# Patient Record
Sex: Female | Born: 1975 | Race: White | Hispanic: No | Marital: Married | Smoking: Never smoker
Health system: Southern US, Community
[De-identification: ages and names within clinical notes are randomized; demographics above are authoritative.]

## PROBLEM LIST (undated history)

## (undated) DIAGNOSIS — J45909 Unspecified asthma, uncomplicated: Secondary | ICD-10-CM

## (undated) DIAGNOSIS — N281 Cyst of kidney, acquired: Secondary | ICD-10-CM

## (undated) DIAGNOSIS — F32A Depression, unspecified: Secondary | ICD-10-CM

## (undated) DIAGNOSIS — Z8679 Personal history of other diseases of the circulatory system: Secondary | ICD-10-CM

## (undated) DIAGNOSIS — E039 Hypothyroidism, unspecified: Secondary | ICD-10-CM

## (undated) DIAGNOSIS — R7303 Prediabetes: Secondary | ICD-10-CM

## (undated) DIAGNOSIS — T7840XA Allergy, unspecified, initial encounter: Secondary | ICD-10-CM

## (undated) DIAGNOSIS — E079 Disorder of thyroid, unspecified: Secondary | ICD-10-CM

## (undated) DIAGNOSIS — F419 Anxiety disorder, unspecified: Secondary | ICD-10-CM

## (undated) DIAGNOSIS — F329 Major depressive disorder, single episode, unspecified: Secondary | ICD-10-CM

## (undated) HISTORY — DX: Allergy, unspecified, initial encounter: T78.40XA

## (undated) HISTORY — DX: Disorder of thyroid, unspecified: E07.9

## (undated) HISTORY — DX: Major depressive disorder, single episode, unspecified: F32.9

## (undated) HISTORY — DX: Anxiety disorder, unspecified: F41.9

## (undated) HISTORY — DX: Depression, unspecified: F32.A

## (undated) HISTORY — PX: OTHER SURGICAL HISTORY: SHX169

## (undated) HISTORY — DX: Cyst of kidney, acquired: N28.1

## (undated) HISTORY — PX: TONSILLECTOMY: SUR1361

---

## 2003-01-19 DIAGNOSIS — E079 Disorder of thyroid, unspecified: Secondary | ICD-10-CM

## 2003-01-19 HISTORY — DX: Disorder of thyroid, unspecified: E07.9

## 2013-01-18 ENCOUNTER — Ambulatory Visit (INDEPENDENT_AMBULATORY_CARE_PROVIDER_SITE_OTHER): Payer: BC Managed Care – PPO | Admitting: Family Medicine

## 2013-01-18 VITALS — BP 124/78 | HR 74 | Temp 98.0°F | Resp 17 | Ht 65.0 in | Wt 190.0 lb

## 2013-01-18 DIAGNOSIS — E039 Hypothyroidism, unspecified: Secondary | ICD-10-CM

## 2013-01-18 DIAGNOSIS — F3289 Other specified depressive episodes: Secondary | ICD-10-CM

## 2013-01-18 DIAGNOSIS — F329 Major depressive disorder, single episode, unspecified: Secondary | ICD-10-CM

## 2013-01-18 DIAGNOSIS — I498 Other specified cardiac arrhythmias: Secondary | ICD-10-CM

## 2013-01-18 DIAGNOSIS — R001 Bradycardia, unspecified: Secondary | ICD-10-CM

## 2013-01-18 DIAGNOSIS — Z23 Encounter for immunization: Secondary | ICD-10-CM

## 2013-01-18 LAB — POCT CBC
Granulocyte percent: 59.9 %G (ref 37–80)
HCT, POC: 47.5 % (ref 37.7–47.9)
Hemoglobin: 14.9 g/dL (ref 12.2–16.2)
LYMPH, POC: 2.9 (ref 0.6–3.4)
MCH, POC: 28.7 pg (ref 27–31.2)
MCHC: 31.4 g/dL — AB (ref 31.8–35.4)
MCV: 91.6 fL (ref 80–97)
MID (cbc): 0.7 (ref 0–0.9)
MPV: 10.3 fL (ref 0–99.8)
POC Granulocyte: 5.3 (ref 2–6.9)
POC LYMPH %: 32.4 % (ref 10–50)
POC MID %: 7.7 % (ref 0–12)
Platelet Count, POC: 324 10*3/uL (ref 142–424)
RBC: 5.19 M/uL (ref 4.04–5.48)
RDW, POC: 15 %
WBC: 8.8 10*3/uL (ref 4.6–10.2)

## 2013-01-18 LAB — TSH: TSH: 15.737 u[IU]/mL — ABNORMAL HIGH (ref 0.350–4.500)

## 2013-01-18 LAB — T4, FREE: Free T4: 0.63 ng/dL — ABNORMAL LOW (ref 0.80–1.80)

## 2013-01-18 MED ORDER — LEVOTHYROXINE SODIUM 100 MCG PO TABS: 100.0000 ug | ORAL_TABLET | Freq: Every day | ORAL | Status: DC

## 2013-01-18 NOTE — Progress Notes (Signed)
Subjective:    Patient ID: Rebecca Meza, female    DOB: 12/12/1975, 38 y.o.   MRN: [id number]  HPI Scribed for Nilda SimmerKristi Jaylaa Gallion MD, the patient was seen in room 9. This chart was scribed by Lewanda RifeAlexandra Hurtado, ED scribe. Patient's care was started at 9:53 AM  HPI Comments: Hx was provided by pt. Rebecca Meza is a 38 y.o. female who presents to the [facility] to be established as a new pt. Pt is requesting TSH and T4 checked. Reports she takes synthroid, but has not taken it since 10/2012. Reports PMHx of hypothyroidism, D&C, and Tonsillectomy. Reports constant mild fatigue. Reports fatigue is alleviated when taking synthroid.   Reports unremarkable pap smear 11/2011 with no hx of abnormal pap smear. Denies hx of mammogram. Denies hx of colonoscopy. Reports unknown tetanus status. Denies getting a flu shot this year. Reports last eye exam was 12/2012. Last Dentist appointment 12/2012.   Reports she has been married for 8 years. Denies smoking cigarettes. 2-3 glasses of alcohol per week. Reports she moved from [place]. Reports taking Citalopram for anxiety and denies other medications at this time.    LMP 12/27/2012 Reports she gets her period every 3 weeks.   Mother is 3265 PMHx of hypothyroidism and cholecystectomy  Father 3060 Non-hodgkin's lymphoma and pancreatic cancer   Sister is 3940 with no significant PMHx   Past Medical History  Diagnosis Date  . Allergy   . Anxiety   . Depression   . Thyroid disease     History reviewed. No pertinent past surgical history.  Family History  Problem Relation Age of Onset  . Cancer Father     History   Social History  . Marital Status: Married    Spouse Name: N/A    Number of Children: N/A  . Years of Education: N/A   Occupational History  . Not on file.   Social History Main Topics  . Smoking status: Never Smoker   . Smokeless tobacco: Not on file  . Alcohol Use: No  . Drug Use: No  . Sexual Activity: No    Other Topics Concern  . Not on file   Social History Narrative  . No narrative on file    No Known Allergies  There are no active problems to display for this patient.   Review of Systems  Constitutional: Negative for fever.  Endocrine:       Fatigue   Psychiatric/Behavioral:       Mild depression        A complete 10 system review of systems was obtained and all systems are negative except as noted in the HPI and PMHx.    Objective:   Physical Exam  Nursing note and vitals reviewed. Constitutional: She is oriented to person, place, and time. She appears well-developed and well-nourished. No distress.  HENT:  Head: Normocephalic and atraumatic.  Eyes: Conjunctivae and EOM are normal.  Neck: Neck supple. No tracheal deviation present. No thyromegaly present.  Cardiovascular: Regular rhythm.  Bradycardia present.   No murmur heard. Pulmonary/Chest: Effort normal and breath sounds normal. No respiratory distress. She has no wheezes. She has no rales.  Musculoskeletal: Normal range of motion.  Lymphadenopathy:    She has no cervical adenopathy.  Neurological: She is alert and oriented to person, place, and time.  Skin: Skin is warm and dry.  Psychiatric: She has a normal mood and affect. Her behavior is normal.   COORDINATION OF CARE:  Nursing notes reviewed. Vital signs reviewed. Initial pt interview and examination performed.   9:53 AM-Discussed work up plan with pt at bedside, which includes EKG, T4 and TSH. Pt agrees with plan.   Treatment plan initiated:Medications - No data to display   Initial diagnostic testing ordered.   Results for orders placed in visit on 01/18/13  POCT CBC      Result Value Range   WBC 8.8  4.6 - 10.2 K/uL   Lymph, poc 2.9  0.6 - 3.4   POC LYMPH PERCENT 32.4  10 - 50 %L   MID (cbc) 0.7  0 - 0.9   POC MID % 7.7  0 - 12 %M   POC Granulocyte 5.3  2 - 6.9   Granulocyte percent 59.9  37 - 80 %G   RBC 5.19  4.04 - 5.48 M/uL    Hemoglobin 14.9  12.2 - 16.2 g/dL   HCT, POC 65.7  84.6 - 47.9 %   MCV 91.6  80 - 97 fL   MCH, POC 28.7  27 - 31.2 pg   MCHC 31.4 (*) 31.8 - 35.4 g/dL   RDW, POC 96.2     Platelet Count, POC 324  142 - 424 K/uL   MPV 10.3  0 - 99.8 fL   INFLUENZA VACCINE ADMINISTERED IN OFFICE.  1. Unspecified hypothyroidism   2. Need for prophylactic vaccination and inoculation against influenza   3. Depressive disorder, not elsewhere classified   4. Bradycardia          Assessment & Plan:  Unspecified hypothyroidism - Plan: POCT CBC, TSH, T4, Free  Need for prophylactic vaccination and inoculation against influenza  Depressive disorder, not elsewhere classified  Bradycardia - Plan: EKG 12-Lead  1. Hypothyroidism:  Uncontrolled due to non-compliance with medication due to lack of insurance and recent move from [place], [place].  Obtain labs. Start Synthroid daily.  RTC 2-3 months for recheck. 2.  Bradycardia: chronic per patient; s/p cardiology consultation in 2009 after D&C due to bradycardia and was released from care.  S/p EKG. 3.  Depression seasonal:  Recurrent; restarted Citalopram in past two weeks.  Emotionally stable.  4.  S/p flu vaccine in office.  Meds ordered this encounter  Medications  . citalopram (CELEXA) 20 MG tablet    Sig: Take 20 mg by mouth daily.  Marland Kitchen levothyroxine (SYNTHROID) 100 MCG tablet    Sig: Take 1 tablet (100 mcg total) by mouth daily before breakfast.    Dispense:  30 tablet    Refill:  3    I personally performed the services described in this documentation, which was scribed in my presence.  The recorded information has been reviewed and is accurate.  Nilda Simmer, M.D.  [facility] [address] [place], [place]  [address] [phone] phone [phone] fax

## 2013-01-24 ENCOUNTER — Telehealth: Payer: Self-pay | Admitting: Family Medicine

## 2013-01-24 NOTE — Telephone Encounter (Signed)
Message copied by Tilman NeatPETTY, Kristi Norment L on Wed Jan 24, 2013  8:41 AM ------      Message from: Ethelda ChickSMITH, KRISTI M      Created: Thu Jan 18, 2013 10:01 AM       Please schedule follow-up OV with me in 3 months. ------

## 2013-01-24 NOTE — Telephone Encounter (Signed)
Appt. Has been made for April 23, 2013.

## 2013-04-16 ENCOUNTER — Telehealth: Payer: Self-pay | Admitting: *Deleted

## 2013-04-16 DIAGNOSIS — E039 Hypothyroidism, unspecified: Secondary | ICD-10-CM

## 2013-04-16 NOTE — Telephone Encounter (Signed)
Pt coming in for a thyroid check. Do you want us to place the order for a TSH for patient to come in before her appt?

## 2013-04-16 NOTE — Telephone Encounter (Signed)
Pt. Has an appt. W/ Dr. Katrinka BlazingSmith next Monday. She would like to know if we can have the orders put in the computer so that she can come in the next couple of days & have her blood work done so the results will be in by her appt. Please call [phone]

## 2013-04-16 NOTE — Telephone Encounter (Signed)
Yes, TSH and free T4  Dx: 244.9.  Thanks.

## 2013-04-17 NOTE — Telephone Encounter (Signed)
Order has been place for future order. Pt has been advised. She will be in later this week.

## 2013-04-18 ENCOUNTER — Other Ambulatory Visit (INDEPENDENT_AMBULATORY_CARE_PROVIDER_SITE_OTHER): Payer: BC Managed Care – PPO | Admitting: Radiology

## 2013-04-18 DIAGNOSIS — E039 Hypothyroidism, unspecified: Secondary | ICD-10-CM

## 2013-04-18 LAB — T4, FREE: Free T4: 1 ng/dL (ref 0.80–1.80)

## 2013-04-18 LAB — TSH: TSH: 5.711 u[IU]/mL — ABNORMAL HIGH (ref 0.350–4.500)

## 2013-04-18 NOTE — Progress Notes (Signed)
Pt here for labs only. 

## 2013-04-23 ENCOUNTER — Ambulatory Visit (INDEPENDENT_AMBULATORY_CARE_PROVIDER_SITE_OTHER): Payer: BC Managed Care – PPO | Admitting: Family Medicine

## 2013-04-23 ENCOUNTER — Encounter: Payer: Self-pay | Admitting: Family Medicine

## 2013-04-23 VITALS — BP 120/76 | HR 63 | Temp 98.2°F | Resp 16 | Ht 64.5 in | Wt 183.4 lb

## 2013-04-23 DIAGNOSIS — F411 Generalized anxiety disorder: Secondary | ICD-10-CM

## 2013-04-23 DIAGNOSIS — E669 Obesity, unspecified: Secondary | ICD-10-CM

## 2013-04-23 DIAGNOSIS — E039 Hypothyroidism, unspecified: Secondary | ICD-10-CM

## 2013-04-23 MED ORDER — LEVOTHYROXINE SODIUM 125 MCG PO TABS: 125.0000 ug | ORAL_TABLET | Freq: Every day | ORAL | Status: DC

## 2013-04-23 NOTE — Progress Notes (Signed)
Subjective:    Patient ID: Rebecca Meza, female    DOB: 07/12/1975, 38 y.o.   MRN: [id number]  This chart was scribed for Ethelda ChickKristi M Germany Chelf, MD by Blanchard KelchNicole Curnes, ED Scribe. The patient was seen in room 21. Patient's care was started at 11:42 AM.  Chief Complaint  Patient presents with  . Follow-up    labs-thyroid levels and consult on weight loss   PCP: No PCP Per Patient  HPI  Rebecca HausenSuzanne E Adcock is a 38 y.o. female who presents to office for a follow up for hypothyroidism/depression with anxiety. Last office visit was 01/2013. This is a three month follow up.    1. Hypothyroidism: Her TSH level on 04/18/13 was 5.7 and free T4 was 1.00. She has been taking her Synthroid 100mcg daily compliantly. She has lost seven pounds since being seen three months ago. However, she states that her biggest complaint has been not being able to lose weight for ten years. She has had issues with tendonitis and soft tissue injuries that have caused her to be unable to even do low impact exercise. She states that even with walking she tends to pull a groin muscle. She has torn cartilage in her knee with biking.    2. Depression with Anxiety: She restarted her Celexa at the last visit and states she took it for about two weeks before stopping. Her anxiety is typically better on Celexa. She states her mood and anxiety have been relatively stable until recently. She is now having anxious days every day. She attributes any changes to issues with her thyroid and states she can tell that when her mood is off so is her thyroid. She is considering weight loss medication and was wondering about the possible complications with taking the medications concurrently or if those medications would be an option. She has been sleeping well for the most part. She sometimes wakes up early at 4:00 AM and is unable to go back to sleep until its time to get up. She states this is a chronic thing and is unsure if it is due to anxiety. She  denies HI or SI.   3. Weight Loss: Her weight has been fluctuating for ten years and she is unable to keep it down permanently. She typically eats a bone broth with a veggie egg omelet. She tends to eat unhealthy snacks for lunch and then a small portion for dinner. She states she is always hungry. She incorporates a lot of protein in her diet. She drinks water during the day and a glass of milk occasionally. She will have a cup of tea or coffee in the morning. She drinks soda about once a month at maximum. She drinks wine about twice a week on average.      There are no active problems to display for this patient.  Past Medical History  Diagnosis Date  . Allergy   . Anxiety   . Depression   . Thyroid disease    No past surgical history on file. No Known Allergies Prior to Admission medications   Medication Sig Start Date End Date Taking? Authorizing Provider  citalopram (CELEXA) 20 MG tablet Take 20 mg by mouth daily.    Historical Provider, MD  levothyroxine (SYNTHROID) 100 MCG tablet Take 1 tablet (100 mcg total) by mouth daily before breakfast. 01/18/13   Ethelda ChickKristi M Kenzley Ke, MD   History   Social History  . Marital Status: Married    Spouse Name: N/A  Number of Children: N/A  . Years of Education: N/A   Occupational History  . Not on file.   Social History Main Topics  . Smoking status: Never Smoker   . Smokeless tobacco: Not on file  . Alcohol Use: No  . Drug Use: No  . Sexual Activity: No   Other Topics Concern  . Not on file   Social History Narrative  . No narrative on file    Review of Systems  Constitutional: Negative for fever, appetite change and unexpected weight change.  HENT: Negative for drooling.   Eyes: Negative for discharge.  Respiratory: Negative for cough.   Cardiovascular: Negative for leg swelling.  Gastrointestinal: Negative for vomiting.  Endocrine: Negative for cold intolerance, heat intolerance, polydipsia, polyphagia and polyuria.    Genitourinary: Negative for hematuria.  Musculoskeletal: Negative for gait problem.  Skin: Negative for rash.  Allergic/Immunologic: Negative for immunocompromised state.  Neurological: Negative for dizziness, tremors, speech difficulty and light-headedness.  Hematological: Negative for adenopathy.  Psychiatric/Behavioral: Positive for sleep disturbance. Negative for suicidal ideas, confusion and self-injury. The patient is nervous/anxious.        Objective:   Physical Exam  Nursing note and vitals reviewed. Constitutional: She is oriented to person, place, and time. She appears well-developed and well-nourished. No distress.  HENT:  Head: Normocephalic and atraumatic.  Mouth/Throat: Oropharynx is clear and moist.  Eyes: Conjunctivae and EOM are normal. Pupils are equal, round, and reactive to light.  Neck: Neck supple. No tracheal deviation present. No thyromegaly present.  Cardiovascular: Normal rate, regular rhythm and normal heart sounds.  Exam reveals no gallop and no friction rub.   No murmur heard. Pulmonary/Chest: Effort normal and breath sounds normal. No respiratory distress. She has no wheezes. She has no rales.  Abdominal: Soft. Bowel sounds are normal. She exhibits no distension. There is no tenderness.  Musculoskeletal: Normal range of motion.  Lymphadenopathy:    She has no cervical adenopathy.  Neurological: She is alert and oriented to person, place, and time.  Skin: Skin is warm and dry.  Psychiatric: She has a normal mood and affect. Her behavior is normal.       Assessment & Plan:  Unspecified hypothyroidism  Generalized anxiety disorder  Obesity, unspecified  1. Hypothyroidism: uncontrolled yet improved; reviewed recent lab results with patient during visit; increase Synthroid to daily.  Follow-up in three months. 2.  Anxiety: uncontrolled due to non-compliance with Celexa; advised to restart Celexa.  Chronic anxiety thus likely warrants chronic  medication. 3.  Obesity: improved since last visit; advised to continue to attempt to exercise even if low-impact; also advised to limit caloric intake to 1200-1500kcal per day.    Meds ordered this encounter  Medications  . levothyroxine (SYNTHROID) 125 MCG tablet    Sig: Take 1 tablet (125 mcg total) by mouth daily before breakfast.    Dispense:  30 tablet    Refill:  5   I personally performed the services described in this documentation, which was scribed in my presence.  The recorded information has been reviewed and is accurate.  Nilda Simmer, M.D.  [facility] [address] [place], [place]  [address] [phone] phone [phone] fax

## 2013-07-23 ENCOUNTER — Encounter: Payer: Self-pay | Admitting: Family Medicine

## 2013-07-23 ENCOUNTER — Ambulatory Visit (INDEPENDENT_AMBULATORY_CARE_PROVIDER_SITE_OTHER): Payer: BC Managed Care – PPO | Admitting: Family Medicine

## 2013-07-23 VITALS — BP 112/78 | HR 47 | Temp 98.7°F | Resp 16

## 2013-07-23 DIAGNOSIS — Z13220 Encounter for screening for lipoid disorders: Secondary | ICD-10-CM

## 2013-07-23 DIAGNOSIS — E039 Hypothyroidism, unspecified: Secondary | ICD-10-CM

## 2013-07-23 DIAGNOSIS — M25511 Pain in right shoulder: Secondary | ICD-10-CM

## 2013-07-23 DIAGNOSIS — Z01419 Encounter for gynecological examination (general) (routine) without abnormal findings: Secondary | ICD-10-CM

## 2013-07-23 DIAGNOSIS — Z131 Encounter for screening for diabetes mellitus: Secondary | ICD-10-CM

## 2013-07-23 DIAGNOSIS — Z0000 Encounter for general adult medical examination without abnormal findings: Secondary | ICD-10-CM

## 2013-07-23 DIAGNOSIS — M25519 Pain in unspecified shoulder: Secondary | ICD-10-CM

## 2013-07-23 DIAGNOSIS — L989 Disorder of the skin and subcutaneous tissue, unspecified: Secondary | ICD-10-CM

## 2013-07-23 LAB — CBC WITH DIFFERENTIAL/PLATELET
Basophils Absolute: 0 10*3/uL (ref 0.0–0.1)
Basophils Relative: 0 % (ref 0–1)
EOS PCT: 3 % (ref 0–5)
Eosinophils Absolute: 0.2 10*3/uL (ref 0.0–0.7)
HEMATOCRIT: 44.2 % (ref 36.0–46.0)
HEMOGLOBIN: 15.2 g/dL — AB (ref 12.0–15.0)
LYMPHS PCT: 29 % (ref 12–46)
Lymphs Abs: 1.9 10*3/uL (ref 0.7–4.0)
MCH: 28.4 pg (ref 26.0–34.0)
MCHC: 34.4 g/dL (ref 30.0–36.0)
MCV: 82.5 fL (ref 78.0–100.0)
MONO ABS: 0.5 10*3/uL (ref 0.1–1.0)
MONOS PCT: 7 % (ref 3–12)
NEUTROS ABS: 4 10*3/uL (ref 1.7–7.7)
Neutrophils Relative %: 61 % (ref 43–77)
Platelets: 314 10*3/uL (ref 150–400)
RBC: 5.36 MIL/uL — ABNORMAL HIGH (ref 3.87–5.11)
RDW: 14.3 % (ref 11.5–15.5)
WBC: 6.6 10*3/uL (ref 4.0–10.5)

## 2013-07-23 LAB — COMPLETE METABOLIC PANEL WITH GFR
ALBUMIN: 4.4 g/dL (ref 3.5–5.2)
ALT: 9 U/L (ref 0–35)
AST: 14 U/L (ref 0–37)
Alkaline Phosphatase: 55 U/L (ref 39–117)
BUN: 13 mg/dL (ref 6–23)
CALCIUM: 9.7 mg/dL (ref 8.4–10.5)
CHLORIDE: 107 meq/L (ref 96–112)
CO2: 24 meq/L (ref 19–32)
Creat: 0.85 mg/dL (ref 0.50–1.10)
GFR, Est African American: >89 mL/min
GFR, Est Non African American: 87 mL/min
GLUCOSE: 97 mg/dL (ref 70–99)
POTASSIUM: 4.8 meq/L (ref 3.5–5.3)
Sodium: 140 mEq/L (ref 135–145)
Total Bilirubin: 0.5 mg/dL (ref 0.2–1.2)
Total Protein: 7.1 g/dL (ref 6.0–8.3)

## 2013-07-23 LAB — POCT URINALYSIS DIPSTICK
Bilirubin, UA: NEGATIVE
Glucose, UA: NEGATIVE
Ketones, UA: NEGATIVE
NITRITE UA: NEGATIVE
PROTEIN UA: NEGATIVE
RBC UA: NEGATIVE
SPEC GRAV UA: 1.02
UROBILINOGEN UA: 0.2
pH, UA: 5.5

## 2013-07-23 LAB — T4, FREE: FREE T4: 1.14 ng/dL (ref 0.80–1.80)

## 2013-07-23 LAB — HEMOGLOBIN A1C
Hgb A1c MFr Bld: 5.5 % (ref <5.7)
Mean Plasma Glucose: 111 mg/dL (ref <117)

## 2013-07-23 LAB — TSH: TSH: 0.988 u[IU]/mL (ref 0.350–4.500)

## 2013-07-23 LAB — LIPID PANEL
Cholesterol: 211 mg/dL — ABNORMAL HIGH (ref 0–200)
HDL: 48 mg/dL (ref >39)
LDL CALC: 141 mg/dL — AB (ref 0–99)
Total CHOL/HDL Ratio: 4.4 Ratio
Triglycerides: 109 mg/dL (ref <150)
VLDL: 22 mg/dL (ref 0–40)

## 2013-07-23 NOTE — Progress Notes (Signed)
Subjective:  This chart was scribed for  Rebecca Forts, MD  by Rebecca Meza, [facility] Scribe. The patient was seen in room and the patient's care was started at 12:44 PM.  Patient ID: Rebecca Meza, female    DOB: 01/07/1976, 38 y.o.   MRN: [id number]  07/23/2013  Annual Exam, sore on nose, sore on back and Shoulder Pain   Shoulder Pain    HPI Comments: Rebecca Meza is a 38 y.o. female who arrives to the [facility] for an annual exam. Pt reports having a persistent scar on her nose and back that will not resolve.She also complains of left shoulder pain that is worse with extension. The pain is worse with walking and she denies pain with running. Denies neck pain. Denies chest pain. She has leg swelling if she is standing on her feet. Pt denies SOB. She has warts on the bottom of her foot. Denies trouble hearing or visual disturbances. Denies hearing loss or tinnitus. Denies urinary or bowel incontinence. She has abdominal pain with eating chocolate. Denies diarrhea, constipation, and hematochezia.     Pt's last eye exam was two years ago and wears prescription reading glasses. Her last visit to the dentist was last year. She has year round allergies and take benadryl as need. Pt has a regular menses that is not very heavy. Denies spotting or abnormal pap smear.Pt has left breast pain just before her mense starts.   Pt's last physical was 11/2012. Pt had a normal pap smear. She never had a mammogram or colonoscopy. Pt is unsure of her last tetanus shot.    She had two children, 55 and 53 years old. Pt had a pertussis shot the day her youngest child was born, three years ago in the hospital.    She has a hx of borderline thyroid conditions and has been on medication for the past 10 years. Pt's mother had thyroid complication. Denies hx of skin cancer.   Her 80 y.o. mother has a hx of heart palpitations, gallstones, thyroid issues. and basal skin  cancer. Pt's father has non active pancreatic cancer and is on chemotherapy. She has a sister and no brothers.   Pt is happily married for the past eight years. She moved from [place] last year. Pt is a stay at home. She does not smoke and drinks occasionally. Denies any drugs. Pt runs 9-11 miles a week. She wears her seat belt. Pt wears 50  Spf sunscreen.    Pt does not have any allergies to medications. Pt lost 5 lbs in 3 months.  She has never seen a dermatologist.   Review of Systems  Constitutional: Negative for unexpected weight change.  HENT: Negative for tinnitus.   Eyes: Negative for visual disturbance.  Respiratory: Negative for shortness of breath.   Cardiovascular: Positive for palpitations and leg swelling. Negative for chest pain.  Gastrointestinal: Negative for nausea, vomiting, abdominal pain, diarrhea, constipation and blood in stool.  Genitourinary: Negative for enuresis and menstrual problem.  Musculoskeletal: Positive for arthralgias and myalgias. Negative for joint swelling.  Skin: Positive for wound.  Allergic/Immunologic: Positive for food allergies.  Neurological: Negative for headaches.  Psychiatric/Behavioral: Negative for sleep disturbance.  All other systems reviewed and are negative.   Past Medical History  Diagnosis Date   Anxiety    Depression    Allergy     oral antihistamine; seasonal.   Thyroid disease 01/19/2003    hypothyroidism  History reviewed. No pertinent past surgical history.  No Known Allergies Current Outpatient Prescriptions  Medication Sig Dispense Refill   levothyroxine (SYNTHROID) 125 MCG tablet Take 1 tablet (125 mcg total) by mouth daily before breakfast.  30 tablet  5   citalopram (CELEXA) 20 MG tablet Take 20 mg by mouth daily.       No current facility-administered medications for this visit.   History   Social History   Marital Status: Married    Spouse Name: N/A    Number of Children: N/A   Years of Education:  N/A   Occupational History   Not on file.   Social History Main Topics   Smoking status: Never Smoker    Smokeless tobacco: Not on file   Alcohol Use: No   Drug Use: No   Sexual Activity: No   Other Topics Concern   Not on file   Social History Narrative   Marital status: married x 8 years; happily married; no abuse; moved from [place] in 2014.      Children two children (8,3)      Lives: with husband, two children.      Employment:  Homemaker      Tobacco: none      Alcohol:  Socially      Drugs: none      Exercise:  Running every other day 2 miles alternating with 3 miles.       Seatbelt:  100%      Guns:  None      Sunscreen: SPF 50-70   Family History  Problem Relation Age of Onset   Cancer Father 66    pancreatic cancer   Hypothyroidism Mother        Objective:    BP 112/78   Pulse 47   Temp(Src) 98.7 F (37.1 C) (Oral)   Resp 16   SpO2 99%   LMP 06/29/2013  Physical Exam  Nursing note and vitals reviewed. Constitutional: She is oriented to person, place, and time. She appears well-developed and well-nourished. No distress.  HENT:  Head: Normocephalic and atraumatic.  Right Ear: External ear normal.  Left Ear: External ear normal.  Nose: Nose normal.  Mouth/Throat: Oropharynx is clear and moist.  Dry scaly patch on the bridge of her nose  Eyes: Conjunctivae and EOM are normal. Pupils are equal, round, and reactive to light.  Neck: Normal range of motion and full passive range of motion without pain. Neck supple. No JVD present. Carotid bruit is not present. No tracheal deviation present. No thyromegaly present.  Cardiovascular: Normal rate, regular rhythm and normal heart sounds.  Exam reveals no gallop and no friction rub.   No murmur heard. Pulmonary/Chest: Effort normal and breath sounds normal. No respiratory distress. She has no wheezes. She has no rales. Right breast exhibits no inverted nipple, no mass, no nipple discharge, no skin change and  no tenderness. Left breast exhibits no inverted nipple, no mass, no nipple discharge, no skin change and no tenderness. Breasts are symmetrical.  Abdominal: Soft. Bowel sounds are normal. She exhibits no distension and no mass. There is no tenderness. There is no rebound and no guarding.  Genitourinary: Vagina normal and uterus normal. There is no rash, tenderness or lesion on the right labia. There is no rash, tenderness or lesion on the left labia. Cervix exhibits no motion tenderness. Right adnexum displays no mass, no tenderness and no fullness. Left adnexum displays no mass, no tenderness and no fullness.  Musculoskeletal:  Normal range of motion.       Right shoulder: Normal.       Left shoulder: Normal.       Cervical back: Normal.  Lymphadenopathy:    She has no cervical adenopathy.  Neurological: She is alert and oriented to person, place, and time. She has normal reflexes. No cranial nerve deficit. She exhibits normal muscle tone. Coordination normal.  Skin: Skin is warm and dry. No rash noted. She is not diaphoretic. No erythema. No pallor.  Psychiatric: She has a normal mood and affect. Her behavior is normal. Judgment and thought content normal.   Results for orders placed in visit on 07/23/13  CBC WITH DIFFERENTIAL      Result Value Ref Range   WBC 6.6  4.0 - 10.5 K/uL   RBC 5.36 (*) 3.87 - 5.11 MIL/uL   Hemoglobin 15.2 (*) 12.0 - 15.0 g/dL   HCT 44.2  36.0 - 46.0 %   MCV 82.5  78.0 - 100.0 fL   MCH 28.4  26.0 - 34.0 pg   MCHC 34.4  30.0 - 36.0 g/dL   RDW 14.3  11.5 - 15.5 %   Platelets 314  150 - 400 K/uL   Neutrophils Relative % 61  43 - 77 %   Neutro Abs 4.0  1.7 - 7.7 K/uL   Lymphocytes Relative 29  12 - 46 %   Lymphs Abs 1.9  0.7 - 4.0 K/uL   Monocytes Relative 7  3 - 12 %   Monocytes Absolute 0.5  0.1 - 1.0 K/uL   Eosinophils Relative 3  0 - 5 %   Eosinophils Absolute 0.2  0.0 - 0.7 K/uL   Basophils Relative 0  0 - 1 %   Basophils Absolute 0.0  0.0 - 0.1 K/uL    Smear Review Criteria for review not met    COMPLETE METABOLIC PANEL WITH GFR      Result Value Ref Range   Sodium 140  135 - 145 mEq/L   Potassium 4.8  3.5 - 5.3 mEq/L   Chloride 107  96 - 112 mEq/L   CO2 24  19 - 32 mEq/L   Glucose, Bld 97  70 - 99 mg/dL   BUN 13  6 - 23 mg/dL   Creat 0.85  0.50 - 1.10 mg/dL   Total Bilirubin 0.5  0.2 - 1.2 mg/dL   Alkaline Phosphatase 55  39 - 117 U/L   AST 14  0 - 37 U/L   ALT 9  0 - 35 U/L   Total Protein 7.1  6.0 - 8.3 g/dL   Albumin 4.4  3.5 - 5.2 g/dL   Calcium 9.7  8.4 - 10.5 mg/dL   GFR, Est African American >89     GFR, Est Non African American 87    HEMOGLOBIN A1C      Result Value Ref Range   Hemoglobin A1C 5.5  <5.7 %   Mean Plasma Glucose 111  <117 mg/dL  LIPID PANEL      Result Value Ref Range   Cholesterol 211 (*) 0 - 200 mg/dL   Triglycerides 109  <150 mg/dL   HDL 48  >39 mg/dL   Total CHOL/HDL Ratio 4.4     VLDL 22  0 - 40 mg/dL   LDL Cholesterol 141 (*) 0 - 99 mg/dL  T4, FREE      Result Value Ref Range   Free T4 1.14  0.80 - 1.80 ng/dL  TSH  Result Value Ref Range   TSH 0.988  0.350 - 4.500 uIU/mL  POCT URINALYSIS DIPSTICK      Result Value Ref Range   Color, UA yellow     Clarity, UA clear     Glucose, UA neg     Bilirubin, UA neg     Ketones, UA neg     Spec Grav, UA 1.020     Blood, UA neg     pH, UA 5.5     Protein, UA neg     Urobilinogen, UA 0.2     Nitrite, UA neg     Leukocytes, UA small (1+)    PAP IG AND HPV HIGH-RISK      Result Value Ref Range   HPV DNA High Risk Not Detected     Specimen adequacy: SEE NOTE     FINAL DIAGNOSIS: SEE NOTE     Cytotechnologist: SEE NOTE         Assessment & Plan:  Routine general medical examination at a health care facility  Unspecified hypothyroidism - Plan: CBC with Differential, T4, free, TSH  Skin lesion - Plan: Ambulatory referral to Dermatology  Pain in joint, shoulder region, right - Plan: Ambulatory referral to Orthopedic Surgery  Routine  gynecological examination - Plan: POCT urinalysis dipstick, Pap IG and HPV (high risk) DNA detection  Screening cholesterol level - Plan: Lipid panel  Screening for diabetes mellitus - Plan: COMPLETE METABOLIC PANEL WITH GFR, Hemoglobin A1c  1. Complete Physical examination: anticipatory guidance --- weight loss, continued exercise. Pap smear obtained.  Immunizations UTD.  2. Gynecological exam: pap smear obtained; breast exam and pelvic examinations completed. 3.  Hypothyroidism: controlled; continue current medication.  4.  Skin lesion nasal: New. Refer to dermatology. 5.  R shoulder pain: chronic with recent worsening; refer to ortho.   No orders of the defined types were placed in this encounter.    No Follow-up on file.  I personally performed the services described in this documentation, which was scribed in my presence.  The recorded information has been reviewed and is accurate.  Rebecca Meza, M.D.  [facility] [address] [place], [place]  [address] [phone] phone [phone] fax

## 2013-07-25 LAB — PAP IG AND HPV HIGH-RISK: HPV DNA HIGH RISK: NOT DETECTED

## 2013-07-31 ENCOUNTER — Encounter: Payer: Self-pay | Admitting: Family Medicine

## 2013-07-31 DIAGNOSIS — E039 Hypothyroidism, unspecified: Secondary | ICD-10-CM | POA: Insufficient documentation

## 2013-07-31 MED ORDER — LEVOTHYROXINE SODIUM 125 MCG PO TABS: 125.0000 ug | ORAL_TABLET | Freq: Every day | ORAL | Status: DC

## 2013-10-04 ENCOUNTER — Other Ambulatory Visit: Payer: Self-pay | Admitting: Family Medicine

## 2013-11-12 ENCOUNTER — Telehealth: Payer: Self-pay

## 2013-11-12 NOTE — Telephone Encounter (Signed)
Pt would like to let Dr. Katrinka BlazingSmith know that she is having a hard time with her allergies and over the counter medications are not helping. The pt is requesting Singulair if Dr. Katrinka BlazingSmith can agree to it.

## 2013-11-12 NOTE — Telephone Encounter (Signed)
Call--- I don't see Singulair on her medication list nor any allergy medication.  Recommend Claritin or Zyrtec or Allegra daily; also recommend OTC Flonase once daily. If no improvement with this regimen in two weeks, recommend office visit.

## 2013-11-13 NOTE — Telephone Encounter (Signed)
Pt CB and reported that she has been using Claritin and Zyrtec, but has not tried Flonase. She did say that she has tried Nasacort and it makes her sneeze a lot and nose sore. She agreed to try the Flonase as advised to see if she tolerates it and if it helps. She will CB or RTC.

## 2013-11-13 NOTE — Telephone Encounter (Signed)
LM for rtn call. 

## 2013-12-22 ENCOUNTER — Ambulatory Visit (INDEPENDENT_AMBULATORY_CARE_PROVIDER_SITE_OTHER): Payer: BC Managed Care – PPO | Admitting: Emergency Medicine

## 2013-12-22 VITALS — BP 140/90 | HR 69 | Temp 99.1°F | Resp 16 | Ht 65.0 in | Wt 189.0 lb

## 2013-12-22 DIAGNOSIS — J209 Acute bronchitis, unspecified: Secondary | ICD-10-CM

## 2013-12-22 DIAGNOSIS — J0140 Acute pansinusitis, unspecified: Secondary | ICD-10-CM

## 2013-12-22 MED ORDER — PSEUDOEPHEDRINE-GUAIFENESIN ER 60-600 MG PO TB12: 1.0000 | ORAL_TABLET | Freq: Two times a day (BID) | ORAL | Status: DC

## 2013-12-22 MED ORDER — PROMETHAZINE-CODEINE 6.25-10 MG/5ML PO SYRP: 5.0000 mL | ORAL_SOLUTION | Freq: Four times a day (QID) | ORAL | Status: DC | PRN

## 2013-12-22 MED ORDER — AMOXICILLIN-POT CLAVULANATE 875-125 MG PO TABS: 1.0000 | ORAL_TABLET | Freq: Two times a day (BID) | ORAL | Status: DC

## 2013-12-22 NOTE — Patient Instructions (Signed)

## 2013-12-22 NOTE — Progress Notes (Signed)
[facility] [address], [place] [id number] [phone]  Date:  12/22/2013   Name:  Rebecca Meza   DOB:  04/26/1975   MRN:  [id number]  PCP:  Nilda SimmerSMITH,KRISTI, MD    Chief Complaint: Sore Throat   History of Present Illness:  Rebecca Meza is a 38 y.o. very pleasant female patient who presents with the following:  Patient has a one week history of nasal congestion and a mucopurulent discharge.   She has a moderate post nasal drip worse at night and a profound sore throat She has a non productive cough  Some fever but no chills. No wheezing or shortness of breath  No nausea or vomiting or stool change No improvement with over the counter medications or other home remedies.  Denies other complaint or health concern today.   Patient Active Problem List   Diagnosis Date Noted  . Unspecified hypothyroidism 07/31/2013    Past Medical History  Diagnosis Date  . Anxiety   . Depression   . Allergy     oral antihistamine; seasonal.  . Thyroid disease 01/19/2003    hypothyroidism    History reviewed. No pertinent past surgical history.  History  Substance Use Topics  . Smoking status: Never Smoker   . Smokeless tobacco: Never Used  . Alcohol Use: No    Family History  Problem Relation Age of Onset  . Cancer Father [id number]    pancreatic cancer  . Hypothyroidism Mother     No Known Allergies  Medication list has been reviewed and updated.  Current Outpatient Prescriptions on File Prior to Visit  Medication Sig Dispense Refill  . levothyroxine (SYNTHROID, LEVOTHROID) 125 MCG tablet Take 1 tablet (125 mcg total) by mouth daily before breakfast. 30 tablet 11  . citalopram (CELEXA) 20 MG tablet Take 20 mg by mouth daily.    Marland Kitchen. SYNTHROID 125 MCG tablet TAKE 1 TABLET (125 MCG TOTAL) BY MOUTH DAILY BEFORE BREAKFAST. (Patient not taking: Reported on 12/22/2013) 30 tablet 4   No current facility-administered medications on file prior to visit.     Review of Systems:  As per HPI, otherwise negative.    Physical Examination: Filed Vitals:   12/22/13 0841  BP: 140/90  Pulse: 69  Temp: 99.1 F (37.3 C)  Resp: 16   Filed Vitals:   12/22/13 0841  Height: 5\' 5"  (1.651 m)  Weight: 189 lb (85.73 kg)   Body mass index is 31.45 kg/(m^2). Ideal Body Weight: Weight in (lb) to have BMI = 25: 149.9  GEN: WDWN, NAD, Non-toxic, A & O x 3 HEENT: Atraumatic, Normocephalic. Neck supple. No masses, No LAD.  Throat red and injected Ears and Nose: No external deformity. CV: RRR, No M/G/R. No JVD. No thrill. No extra heart sounds. PULM: CTA B, no wheezes, crackles, rhonchi. No retractions. No resp. distress. No accessory muscle use. ABD: S, NT, ND, +BS. No rebound. No HSM. EXTR: No c/c/e NEURO Normal gait.  PSYCH: Normally interactive. Conversant. Not depressed or anxious appearing.  Calm demeanor.    Assessment and Plan: Sinusitis Bronchitis augmentin mucinex  Phen c cod  Signed,  Phillips OdorJeffery Elvera Almario, MD

## 2013-12-31 ENCOUNTER — Encounter: Payer: Self-pay | Admitting: Family Medicine

## 2013-12-31 ENCOUNTER — Ambulatory Visit (INDEPENDENT_AMBULATORY_CARE_PROVIDER_SITE_OTHER): Payer: BC Managed Care – PPO | Admitting: Family Medicine

## 2013-12-31 VITALS — BP 130/70 | HR 59 | Temp 98.7°F | Resp 16 | Ht 64.5 in | Wt 187.0 lb

## 2013-12-31 DIAGNOSIS — R109 Unspecified abdominal pain: Secondary | ICD-10-CM

## 2013-12-31 DIAGNOSIS — Z23 Encounter for immunization: Secondary | ICD-10-CM

## 2013-12-31 DIAGNOSIS — N644 Mastodynia: Secondary | ICD-10-CM

## 2013-12-31 DIAGNOSIS — K5900 Constipation, unspecified: Secondary | ICD-10-CM

## 2013-12-31 DIAGNOSIS — K30 Functional dyspepsia: Secondary | ICD-10-CM

## 2013-12-31 DIAGNOSIS — E039 Hypothyroidism, unspecified: Secondary | ICD-10-CM

## 2013-12-31 DIAGNOSIS — R102 Pelvic and perineal pain: Secondary | ICD-10-CM

## 2013-12-31 LAB — POCT URINALYSIS DIPSTICK
Bilirubin, UA: NEGATIVE
Blood, UA: NEGATIVE
Glucose, UA: NEGATIVE
Ketones, UA: NEGATIVE
Leukocytes, UA: NEGATIVE
Nitrite, UA: NEGATIVE
Protein, UA: NEGATIVE
Spec Grav, UA: <=1.005
Urobilinogen, UA: 0.2
pH, UA: 5.5

## 2013-12-31 LAB — POCT WET PREP WITH KOH
KOH Prep POC: NEGATIVE
Trichomonas, UA: NEGATIVE
YEAST WET PREP PER HPF POC: NEGATIVE

## 2013-12-31 LAB — POCT URINE PREGNANCY: Preg Test, Ur: NEGATIVE

## 2013-12-31 MED ORDER — POLYETHYLENE GLYCOL 3350 17 GM/SCOOP PO POWD: 17.0000 g | Freq: Two times a day (BID) | ORAL | Status: DC | PRN

## 2013-12-31 NOTE — Progress Notes (Signed)
Subjective:    Patient ID: Rebecca Meza, female    DOB: 07/02/1975, 38 y.o.   MRN: [id number]  HPI Patient presents for stomach and breast pain.   Stomach pain has been present for 3 months and is daily. Pain is in lower abdomin to pelvis. Intensity varies throughout the day and can be 2/10 or 5/10 on pain scale. Feels like an uncomfortable pressure that does not radiate although sometimes feels in lower back. Has hypothyroidism and has episodes of constipation, however, has been going more often lately. Bowel movements not painful and no blood in stool. No h/o IBS. Positive h/o acid reflux, but does not feel like that. Denies urinary sx, fever, trauma, new foods, or recent travel outside of the country. Not worse after meals or in different positions. Ibuprofen does not help. LMP 12/08/13 was regular and pain not worse with cycle. Sexually active with husband and is nonpainful. No abdominal surgeries. Is worried about ovarian cancer.  Breast pain present for past 5 months and usually occurs 2 weeks prior to cycle. Denies feeling any lumps, but feels engorged and burns to the touch.  Denies fever, color change, swelling, or d/c from nipple. Has 1st cousin with breast cancer in 7540s and father that has had lymphoma and pancreatic cancer secondary to his exposure to Agent Orange. Has never had a mammogram. Breast pumped for her 39 year old son.   Review of Systems  Constitutional: Positive for fatigue. Negative for fever, activity change and appetite change.  Respiratory: Negative for shortness of breath.   Cardiovascular: Negative for chest pain.  Gastrointestinal: Positive for nausea (rare), abdominal pain and constipation (secondary to hypothyroid). Negative for vomiting, diarrhea, blood in stool and abdominal distention.  Genitourinary: Positive for pelvic pain. Negative for dysuria, frequency, hematuria, flank pain, vaginal bleeding, vaginal discharge, difficulty urinating, vaginal pain,  menstrual problem and dyspareunia.  Musculoskeletal: Positive for back pain.  Skin: Negative for rash.  Neurological: Negative for dizziness, light-headedness and headaches.       Objective:   Physical Exam  Constitutional: She is oriented to person, place, and time. She appears well-developed and well-nourished. No distress.  Blood pressure 130/70, pulse 59, temperature 98.7 F (37.1 C), temperature source Oral, resp. rate 16, height 5' 4.5" (1.638 m), weight 187 lb (84.823 kg), last menstrual period 12/08/2013, SpO2 99 %.  HENT:  Head: Normocephalic and atraumatic.  Right Ear: External ear normal.  Left Ear: External ear normal.  Neck: Neck supple. No thyromegaly present.  Cardiovascular: Normal rate, regular rhythm and normal heart sounds.  Exam reveals no gallop and no friction rub.   No murmur heard. Pulmonary/Chest: Effort normal and breath sounds normal. She has no wheezes. She has no rhonchi. She has no rales. Right breast exhibits tenderness. Right breast exhibits no inverted nipple, no mass, no nipple discharge and no skin change. Left breast exhibits tenderness. Left breast exhibits no inverted nipple, no mass, no nipple discharge and no skin change. Breasts are symmetrical.  Abdominal: Soft. Normal appearance and bowel sounds are normal. She exhibits no distension and no mass. There is tenderness in the right lower quadrant and left lower quadrant. There is no rebound, no guarding, no CVA tenderness, no tenderness at McBurney's point and negative Murphy's sign. No hernia.  Genitourinary:  Exam performed by Dr. Katrinka BlazingSmith: Right adnexal tenderness.  Musculoskeletal:       Lumbar back: She exhibits pain. She exhibits no tenderness, no bony tenderness, no swelling, no edema, no laceration  and no spasm.  Lymphadenopathy:       Head (right side): No submental, no submandibular and no tonsillar adenopathy present.       Head (left side): No submental, no submandibular and no tonsillar  adenopathy present.    She has no cervical adenopathy.       Right axillary: No lateral adenopathy present.       Left axillary: No lateral adenopathy present.      Right: No inguinal adenopathy present.       Left: No inguinal adenopathy present.  Neurological: She is alert and oriented to person, place, and time.  Skin: Skin is warm, dry and intact. No bruising, no ecchymosis, no lesion and no rash noted. She is not diaphoretic. No erythema. No pallor.    Results for orders placed or performed in visit on 12/31/13  POCT urinalysis dipstick  Result Value Ref Range   Color, UA Light Yellow    Clarity, UA clear    Glucose, UA Negative    Bilirubin, UA Negative    Ketones, UA Negative    Spec Grav, UA <=1.005    Blood, UA negative    pH, UA 5.5    Protein, UA Negative    Urobilinogen, UA 0.2    Nitrite, UA negative    Leukocytes, UA Negative       Assessment & Plan:  1. Stomach pain 3. Constipation, unspecified constipation type 4. Pelvic pain in female - POCT Wet Prep with KOH - CBC with Differential - POCT urinalysis dipstick - Comprehensive metabolic panel - polyethylene glycol powder (GLYCOLAX/MIRALAX) powder; Take 17 g by mouth 2 (two) times daily as needed.  Dispense: 3350 g; Refill: 1  2. Breast tenderness in female - POCT urine pregnancy - CBC with Differential  5. Hypothyroidism, unspecified hypothyroidism type - TSH - T4, free   Donta Mcinroy PA-C  [facility] 12/31/2013 5:22 PM

## 2014-01-01 LAB — COMPREHENSIVE METABOLIC PANEL
AST: 15 U/L (ref 0–37)
Albumin: 4.1 g/dL (ref 3.5–5.2)
Alkaline Phosphatase: 65 U/L (ref 39–117)
BILIRUBIN TOTAL: 0.2 mg/dL (ref 0.2–1.2)
BUN: 9 mg/dL (ref 6–23)
CO2: 26 meq/L (ref 19–32)
CREATININE: 0.59 mg/dL (ref 0.50–1.10)
Calcium: 9.3 mg/dL (ref 8.4–10.5)
Chloride: 106 mEq/L (ref 96–112)
Glucose, Bld: 92 mg/dL (ref 70–99)
Potassium: 4.5 mEq/L (ref 3.5–5.3)
Sodium: 139 mEq/L (ref 135–145)
Total Protein: 6.9 g/dL (ref 6.0–8.3)

## 2014-01-01 LAB — CBC WITH DIFFERENTIAL/PLATELET
Basophils Absolute: 0 10*3/uL (ref 0.0–0.1)
Basophils Relative: 0 % (ref 0–1)
EOS ABS: 0.8 10*3/uL — AB (ref 0.0–0.7)
EOS PCT: 9 % — AB (ref 0–5)
HCT: 43 % (ref 36.0–46.0)
Hemoglobin: 14.2 g/dL (ref 12.0–15.0)
LYMPHS ABS: 3 10*3/uL (ref 0.7–4.0)
Lymphocytes Relative: 33 % (ref 12–46)
MCH: 28.1 pg (ref 26.0–34.0)
MCHC: 33 g/dL (ref 30.0–36.0)
MCV: 85 fL (ref 78.0–100.0)
MPV: 10.8 fL (ref 9.4–12.4)
Monocytes Absolute: 0.7 10*3/uL (ref 0.1–1.0)
Monocytes Relative: 8 % (ref 3–12)
Neutro Abs: 4.5 10*3/uL (ref 1.7–7.7)
Neutrophils Relative %: 50 % (ref 43–77)
Platelets: 371 10*3/uL (ref 150–400)
RBC: 5.06 MIL/uL (ref 3.87–5.11)
RDW: 13.9 % (ref 11.5–15.5)
WBC: 9 10*3/uL (ref 4.0–10.5)

## 2014-01-01 LAB — T4, FREE: FREE T4: 1.12 ng/dL (ref 0.80–1.80)

## 2014-01-01 LAB — TSH: TSH: 4.521 u[IU]/mL — AB (ref 0.350–4.500)

## 2014-01-01 NOTE — Progress Notes (Signed)
Subjective:    Patient ID: Rebecca HausenSuzanne E Dierolf, female    DOB: 10/03/1975, 38 y.o.   MRN: [id number]  12/31/2013  stomach pain, pressure, since Sept 2015, on/off and breast hurts   HPI This 38 y.o. female presents for evaluation of the following:  1.  Lower abdominal pain: onset three months ago.  No fever/chills/sweats. No n/v/d; +intermittent chronic constipation issues.  No bloody stools or black stools. No dysuria, urgency, frequency, nocturia from baseline. No vaginal discharge, irritation, or pain. No dyspareunia.  Eating has no affect on pain.  Pt is worried about ovarian cancer.  LMP last month and normal; contraception is condoms.  2.  B breast tenderness: onset six months ago; onset two weeks before menses; no masses or lumps or nodules appreciated.  No breast cancer in immediate family. No nipple drainage.  Symptoms very bothersome to patient.   Review of Systems  Constitutional: Negative for fever, chills, diaphoresis, fatigue and unexpected weight change.  Eyes: Negative for visual disturbance.  Respiratory: Negative for cough and shortness of breath.   Cardiovascular: Negative for chest pain, palpitations and leg swelling.  Gastrointestinal: Positive for abdominal pain and constipation. Negative for nausea, vomiting and diarrhea.  Endocrine: Negative for cold intolerance, heat intolerance, polydipsia, polyphagia and polyuria.  Genitourinary: Positive for pelvic pain. Negative for dysuria, urgency, frequency, hematuria, flank pain, decreased urine volume, vaginal bleeding, vaginal discharge, enuresis, difficulty urinating, genital sores, vaginal pain, menstrual problem and dyspareunia.  Skin: Negative for rash.  Neurological: Negative for dizziness, tremors, seizures, syncope, facial asymmetry, speech difficulty, weakness, light-headedness, numbness and headaches.    Past Medical History  Diagnosis Date  . Anxiety   . Depression   . Allergy     oral antihistamine;  seasonal.  . Thyroid disease 01/19/2003    hypothyroidism   History reviewed. No pertinent past surgical history. No Known Allergies Current Outpatient Prescriptions  Medication Sig Dispense Refill  . amoxicillin-clavulanate (AUGMENTIN) 875-125 MG per tablet Take 1 tablet by mouth 2 (two) times daily. 20 tablet 0  . levothyroxine (SYNTHROID, LEVOTHROID) 125 MCG tablet Take 1 tablet (125 mcg total) by mouth daily before breakfast. 30 tablet 11  . citalopram (CELEXA) 20 MG tablet Take 20 mg by mouth daily.    . polyethylene glycol powder (GLYCOLAX/MIRALAX) powder Take 17 g by mouth 2 (two) times daily as needed. 3350 g 1  . promethazine-codeine (PHENERGAN WITH CODEINE) 6.25-10 MG/5ML syrup Take 5-10 mLs by mouth every 6 (six) hours as needed. (Patient not taking: Reported on 12/31/2013) 120 mL 0  . pseudoephedrine-guaifenesin (MUCINEX D) 60-600 MG per tablet Take 1 tablet by mouth every 12 (twelve) hours. (Patient not taking: Reported on 12/31/2013) 18 tablet 0  . SYNTHROID 125 MCG tablet TAKE 1 TABLET (125 MCG TOTAL) BY MOUTH DAILY BEFORE BREAKFAST. (Patient not taking: Reported on 12/22/2013) 30 tablet 4   No current facility-administered medications for this visit.       Objective:    BP 130/70 mmHg  Pulse 59  Temp(Src) 98.7 F (37.1 C) (Oral)  Resp 16  Ht 5' 4.5" (1.638 m)  Wt 187 lb (84.823 kg)  BMI 31.61 kg/m2  SpO2 99%  LMP 12/08/2013 Physical Exam  Constitutional: She is oriented to person, place, and time. She appears well-developed and well-nourished. No distress.  HENT:  Head: Normocephalic and atraumatic.  Eyes: Conjunctivae and EOM are normal. Pupils are equal, round, and reactive to light.  Neck: Normal range of motion. Neck supple. Carotid bruit is not  present. No thyromegaly present.  Cardiovascular: Normal rate, regular rhythm, normal heart sounds and intact distal pulses.  Exam reveals no gallop and no friction rub.   No murmur heard. Pulmonary/Chest: Effort  normal and breath sounds normal. She has no wheezes. She has no rales. Right breast exhibits tenderness. Right breast exhibits no inverted nipple, no mass, no nipple discharge and no skin change. Left breast exhibits tenderness. Left breast exhibits no inverted nipple, no mass, no nipple discharge and no skin change. Breasts are symmetrical.  Diffuse TTP B breasts.  Abdominal: Soft. Bowel sounds are normal. She exhibits no distension and no mass. There is no tenderness. There is no rebound and no guarding.  Genitourinary: Uterus normal. There is no rash, tenderness or lesion on the right labia. There is no rash, tenderness or lesion on the left labia. Uterus is not enlarged and not tender. Cervix exhibits no motion tenderness, no discharge and no friability. Right adnexum displays tenderness. Right adnexum displays no mass and no fullness. Left adnexum displays no mass, no tenderness and no fullness. Vaginal discharge found.  Lymphadenopathy:    She has no cervical adenopathy.  Neurological: She is alert and oriented to person, place, and time. No cranial nerve deficit.  Skin: Skin is warm and dry. No rash noted. She is not diaphoretic. No erythema. No pallor.  Psychiatric: She has a normal mood and affect. Her behavior is normal.   Results for orders placed or performed in visit on 12/31/13  POCT urinalysis dipstick  Result Value Ref Range   Color, UA Light Yellow    Clarity, UA clear    Glucose, UA Negative    Bilirubin, UA Negative    Ketones, UA Negative    Spec Grav, UA <=1.005    Blood, UA negative    pH, UA 5.5    Protein, UA Negative    Urobilinogen, UA 0.2    Nitrite, UA negative    Leukocytes, UA Negative   POCT urine pregnancy  Result Value Ref Range   Preg Test, Ur Negative   POCT Wet Prep with KOH  Result Value Ref Range   Trichomonas, UA Negative    Clue Cells Wet Prep HPF POC 0-2    Epithelial Wet Prep HPF POC 5-8    Yeast Wet Prep HPF POC Negative    Bacteria Wet  Prep HPF POC 1+    RBC Wet Prep HPF POC 0-2    WBC Wet Prep HPF POC 20-30    KOH Prep POC Negative    INFLUENZA VACCINE ADMINISTERED.    Assessment & Plan:   1. Constipation, unspecified constipation type   2. Stomach pain   3. Breast tenderness in female   4. Pelvic pain in female   5. Hypothyroidism, unspecified hypothyroidism type   6. Flu vaccine need       1.  Constipation: Chronic; start Miralax bid until stools are runny and then decrease to Miralax once daily.   2.  Abdominal/pelvic pain: New. Located R pelvic region; obtain labs; obtain pelvic US; treat constipation with Miralax.   3.  Breast tenderness B:  New.  Benign breast exam; suggestive of hormonal fluctuations; recommend caffeine limitation. If persists, obtain mammogram. Pt considering OCP to control hormonal fluctuations that may be contributing to breast tenderness.  Pregnancy test negative. 4.  Hypothyroidism: stable; obtain labs. 5.  S/p flu vaccine.   Meds ordered this encounter  Medications  . polyethylene glycol powder (GLYCOLAX/MIRALAX) powder    Sig:  Take 17 g by mouth 2 (two) times daily as needed.    Dispense:  3350 g    Refill:  1    Order Specific Question:  Supervising Provider    Answer:  Ethelda ChickSMITH, Arty Lantzy M [[id number]]    No Follow-up on file.

## 2014-01-01 NOTE — Addendum Note (Signed)
Addended by: Felix AhmadiFRANSEN, Sarissa Dern A on: 01/01/2014 02:24 PM   Modules accepted: Orders

## 2014-01-02 ENCOUNTER — Other Ambulatory Visit: Payer: Self-pay

## 2014-01-02 DIAGNOSIS — K5900 Constipation, unspecified: Secondary | ICD-10-CM

## 2014-01-02 MED ORDER — POLYETHYLENE GLYCOL 3350 17 GM/SCOOP PO POWD: 17.0000 g | Freq: Two times a day (BID) | ORAL | Status: DC | PRN

## 2014-01-02 NOTE — Telephone Encounter (Signed)
Pharm sent for clarification of quantity bc 3350 gm can not be dispensed w/either 255 or 527 gm sizes. At pt's Rxd dose, she should need six 527 mg containers for 90 days which is 3162 gms. Resent Rx for this amount.

## 2014-01-03 ENCOUNTER — Encounter: Payer: Self-pay | Admitting: Family Medicine

## 2014-01-07 ENCOUNTER — Ambulatory Visit
Admission: RE | Admit: 2014-01-07 | Discharge: 2014-01-07 | Disposition: A | Discharge status: Home / Self Care | Payer: BC Managed Care – PPO | Source: Ambulatory Visit | Attending: Family Medicine | Admitting: Family Medicine

## 2014-01-07 ENCOUNTER — Ambulatory Visit
Admission: RE | Admit: 2014-01-07 | Discharge: 2014-01-07 | Disposition: A | Discharge status: Home / Self Care | Payer: Self-pay | Source: Ambulatory Visit | Attending: Family Medicine | Admitting: Family Medicine

## 2014-01-07 DIAGNOSIS — R102 Pelvic and perineal pain: Secondary | ICD-10-CM

## 2014-01-14 ENCOUNTER — Other Ambulatory Visit: Payer: Self-pay | Admitting: Family Medicine

## 2014-01-14 DIAGNOSIS — N83202 Unspecified ovarian cyst, left side: Principal | ICD-10-CM

## 2014-01-14 DIAGNOSIS — N83201 Unspecified ovarian cyst, right side: Secondary | ICD-10-CM

## 2014-01-14 MED ORDER — LEVOTHYROXINE SODIUM 150 MCG PO TABS: 150.0000 ug | ORAL_TABLET | Freq: Every day | ORAL | Status: DC

## 2014-01-15 ENCOUNTER — Other Ambulatory Visit: Payer: Self-pay | Admitting: Family Medicine

## 2014-01-15 DIAGNOSIS — N83201 Unspecified ovarian cyst, right side: Secondary | ICD-10-CM

## 2014-01-15 DIAGNOSIS — N83202 Unspecified ovarian cyst, left side: Principal | ICD-10-CM

## 2014-04-08 ENCOUNTER — Ambulatory Visit (HOSPITAL_COMMUNITY)
Admission: RE | Admit: 2014-04-08 | Discharge: 2014-04-08 | Disposition: A | Discharge status: Home / Self Care | Payer: BLUE CROSS/BLUE SHIELD | Source: Ambulatory Visit | Attending: Family Medicine | Admitting: Family Medicine

## 2014-04-08 DIAGNOSIS — N8320 Unspecified ovarian cysts: Secondary | ICD-10-CM | POA: Insufficient documentation

## 2014-04-08 DIAGNOSIS — N83202 Unspecified ovarian cyst, left side: Secondary | ICD-10-CM

## 2014-04-08 DIAGNOSIS — N83201 Unspecified ovarian cyst, right side: Secondary | ICD-10-CM

## 2014-08-07 ENCOUNTER — Telehealth: Payer: Self-pay

## 2014-08-07 DIAGNOSIS — E034 Atrophy of thyroid (acquired): Secondary | ICD-10-CM

## 2014-08-07 NOTE — Telephone Encounter (Signed)
Rebecca ChessmanSuzanne called about her refill levothyroxine. She wanted to know if Dr. Katrinka BlazingSmith wants her to come in for an OV to receive a refill or can she just come to the walk in and have her blood work done without seeing the doctor. Please call because she told the pharmacy not to renew the script until she saw Dr. Katrinka BlazingSmith.

## 2014-08-08 NOTE — Telephone Encounter (Signed)
Call --- I have placed lab orders in chart for patient to present for lab visit only.  She is due for a complete physical examination; thus, I would recommend her scheduling a physical with me in the upcoming six months.

## 2014-08-09 NOTE — Telephone Encounter (Signed)
Left detailed message on pt's voice mail

## 2014-09-09 ENCOUNTER — Other Ambulatory Visit (INDEPENDENT_AMBULATORY_CARE_PROVIDER_SITE_OTHER): Payer: BLUE CROSS/BLUE SHIELD | Admitting: Family Medicine

## 2014-09-09 DIAGNOSIS — E034 Atrophy of thyroid (acquired): Secondary | ICD-10-CM | POA: Diagnosis not present

## 2014-09-09 DIAGNOSIS — E038 Other specified hypothyroidism: Secondary | ICD-10-CM

## 2014-09-09 LAB — T4, FREE: Free T4: 1.03 ng/dL (ref 0.80–1.80)

## 2014-09-09 LAB — TSH: TSH: 4.365 u[IU]/mL (ref 0.350–4.500)

## 2014-09-09 NOTE — Progress Notes (Signed)
Patient here today for Lab draw only. Patient states she has been taking her mother's Thyroid medication for 6 weeks instead of her prescribed medication. She has been out of her RX.

## 2014-09-12 ENCOUNTER — Encounter: Payer: Self-pay | Admitting: Family Medicine

## 2014-09-13 ENCOUNTER — Other Ambulatory Visit: Payer: Self-pay | Admitting: Family Medicine

## 2014-09-13 MED ORDER — LEVOTHYROXINE SODIUM 150 MCG PO TABS: 150.0000 ug | ORAL_TABLET | Freq: Every day | ORAL | Status: DC

## 2014-09-15 MED ORDER — LEVOTHYROXINE SODIUM 150 MCG PO TABS: 150.0000 ug | ORAL_TABLET | Freq: Every day | ORAL | Status: DC

## 2015-02-17 ENCOUNTER — Telehealth: Payer: Self-pay | Admitting: Family Medicine

## 2015-02-17 DIAGNOSIS — E034 Atrophy of thyroid (acquired): Secondary | ICD-10-CM

## 2015-02-17 NOTE — Telephone Encounter (Signed)
Pt would like to come in for a lab draw only could you please add a TSH to this patient last done i think was 08-2014

## 2015-02-21 NOTE — Addendum Note (Signed)
Addended by: Ethelda Chick on: 02/21/2015 03:59 PM   Modules accepted: Orders

## 2015-02-21 NOTE — Telephone Encounter (Signed)
Call --- last office visit with me was in 12-2013 which was over a year ago; thus, she needs a follow-up office visit.  Please schedule OV.

## 2015-02-23 ENCOUNTER — Telehealth: Payer: Self-pay | Admitting: *Deleted

## 2015-02-23 NOTE — Telephone Encounter (Signed)
Left message for patient to call back. She needs to be seen before she has labs ordered

## 2015-03-28 ENCOUNTER — Encounter: Payer: Self-pay | Admitting: Family Medicine

## 2015-04-07 ENCOUNTER — Telehealth: Payer: Self-pay | Admitting: Family Medicine

## 2015-04-07 DIAGNOSIS — E034 Atrophy of thyroid (acquired): Secondary | ICD-10-CM

## 2015-04-07 NOTE — Telephone Encounter (Signed)
Spoke with pt, she would like to see somebody for her thyroid. Dr. Elvera LennoxGherghe

## 2015-04-07 NOTE — Telephone Encounter (Signed)
Referral placed for endocrinology. Please advise patient.

## 2015-04-07 NOTE — Telephone Encounter (Signed)
PT would [facility].   [phone] Please call if OV is needed..or once request is complete.

## 2015-04-18 ENCOUNTER — Encounter: Payer: BLUE CROSS/BLUE SHIELD | Admitting: Family Medicine

## 2015-05-06 ENCOUNTER — Encounter: Payer: BLUE CROSS/BLUE SHIELD | Admitting: Family Medicine

## 2015-05-29 ENCOUNTER — Encounter: Payer: Self-pay | Admitting: Internal Medicine

## 2015-05-29 ENCOUNTER — Ambulatory Visit (INDEPENDENT_AMBULATORY_CARE_PROVIDER_SITE_OTHER): Payer: BLUE CROSS/BLUE SHIELD | Admitting: Internal Medicine

## 2015-05-29 VITALS — BP 110/60 | HR 62 | Temp 97.9°F | Resp 12 | Ht 65.0 in | Wt 189.0 lb

## 2015-05-29 DIAGNOSIS — E039 Hypothyroidism, unspecified: Secondary | ICD-10-CM | POA: Diagnosis not present

## 2015-05-29 LAB — TSH: TSH: 3.26 u[IU]/mL (ref 0.35–4.50)

## 2015-05-29 LAB — T4, FREE: FREE T4: 0.87 ng/dL (ref 0.60–1.60)

## 2015-05-29 LAB — T3, FREE: T3 FREE: 3.1 pg/mL (ref 2.3–4.2)

## 2015-05-29 NOTE — Patient Instructions (Addendum)
Please stop at the lab.  Please continue Synthroid 150 mcg daily.  Take the thyroid hormone every day, with water, move it at least 30 minutes before breakfast, separated by at least 4 hours from: - acid reflux medications - calcium - iron - multivitamins  Please return in 3-4 months.

## 2015-05-29 NOTE — Progress Notes (Signed)
Patient ID: Rebecca Meza, female   DOB: 30-Nov-1975, 40 y.o.   MRN: [id number]   HPI  Rebecca Meza is a 40 y.o.-year-old female, referred by her PCP, Dr. Katrinka Blazing, for management of hypothyroidism.  Pt. has been dx with hypothyroidism in 2004; she was started on Levothyroxine (LT4) - but did not feel good (fatigue, "sluggish"); then Armour (but TFTs difficult to control), then Synthroid in 2005, last dose change in 2015: 150 mcg.  She takes LT4: - at night - >3h after dinner! - with water - no calcium, iron, PPIs - + multivitamins in late am  I reviewed pt's thyroid tests: Lab Results  Component Value Date   TSH 4.365 09/09/2014   TSH 4.521* 12/31/2013   TSH 0.988 07/23/2013   TSH 5.711* 04/18/2013   TSH 15.737* 01/18/2013   FREET4 1.03 09/09/2014   FREET4 1.12 12/31/2013   FREET4 1.14 07/23/2013   FREET4 1.00 04/18/2013   FREET4 0.63* 01/18/2013    Pt describes: - + weight gain - + fatigue - + cold intolerance - no depression - + constipation - no dry skin - no hair loss  Pt denies feeling nodules in neck, hoarseness, + dysphagia (when nervous and stressed)/no odynophagia, SOB with lying down.  She has + FH of thyroid disorders in: hypothyroidism in mother and possible MGM. No FH of thyroid cancer.  No h/o radiation tx to head or neck.  She takes a thyroid supplement with iodine (kelp, black pepper) - on and off. No Biotin.   ROS: Constitutional: + see HPI Eyes: no blurry vision, no xerophthalmia ENT: no sore throat, no nodules palpated in throat, no dysphagia/odynophagia, no hoarseness Cardiovascular: no CP/SOB/palpitations/leg swelling Respiratory: no cough/SOB Gastrointestinal: no N/V/D/+ C/+ heartburn Musculoskeletal: + muscle/no joint aches Skin: no rashes, + easy bruising Neurological: no tremors/numbness/tingling/dizziness, + HA Psychiatric: no depression/+ anxiety + low libido  Past Medical History  Diagnosis Date  . Anxiety   . Depression   .  Allergy     oral antihistamine; seasonal.  . Thyroid disease 01/19/2003    hypothyroidism   No past surgical history on file. Social History   Social History Main Topics  . Smoking status: Never Smoker   . Smokeless tobacco: Never Used  . Alcohol Use: No  . Drug Use: No  . Sexual Activity: Yes    Birth Control/ Protection: Condom   Other Topics Concern  . Not on file   Social History Narrative   Marital status: married; no abuse; moved from [place] in 2014.      Children two children (8,3)      Lives: with husband, two children.      Employment:  Homemaker      Tobacco: none      Alcohol:  Socially      Drugs: none      Exercise:  Running every other day 2 miles alternating with 3 miles.       Seatbelt:  100%      Guns:  None      Sunscreen: SPF 50-70   No current outpatient prescriptions on file prior to visit.   No current facility-administered medications on file prior to visit.   No Known Allergies Family History  Problem Relation Age of Onset  . Cancer Father 1    pancreatic cancer  . Hypothyroidism Mother    PE: BP 110/60 mmHg  Pulse 62  Temp(Src) 97.9 F (36.6 C) (Oral)  Resp 12  Ht  (1.651  m)  Wt 189 lb (85.73 kg)  BMI 31.45 kg/m2  SpO2 98% Wt Readings from Last 3 Encounters:  05/29/15 189 lb (85.73 kg)  09/09/14 178 lb (80.74 kg)  12/31/13 187 lb (84.823 kg)   Constitutional: overweight, in NAD Eyes: PERRLA, EOMI, no exophthalmos ENT: moist mucous membranes, no thyromegaly, no cervical lymphadenopathy Cardiovascular: RRR, No MRG Respiratory: CTA B Gastrointestinal: abdomen soft, NT, ND, BS+ Musculoskeletal: no deformities, strength intact in all 4 Skin: moist, warm, no rashes Neurological: no tremor with outstretched hands, DTR normal in all 4  ASSESSMENT: 1. Hypothyroidism  PLAN:  1. Patient with long-standing hypothyroidism, on brand name Synthroid therapy, with fluctuating control. Her last TSH was close to the ULN, but this was 9  mo ago. She takes LT4 150 mcg daily >> will continue until TFT results are back >> will check thyroid tests today: TSH, free T4. - She appears euthyroid, but c/o fatigue and has some other possible hypothyroid sxs: overweight - difficult to lose weight, constipation.  - She tells me she is interested in starting Cytomel (LT3), which is OK, but we will need to optimize how she takes the LT4 first to see if this helps her energy. We will move this in am, fasting, as now she is taking it at night. She agrees with this. - we did discuss about Cytomel: half-life, SEs, dosing - We discussed about correct intake of levothyroxine, fasting, with water, separated by at least 30 minutes from breakfast, and separated by more than 4 hours from calcium, iron, multivitamins, acid reflux medications (PPIs). - advised to stop the thyroid supplement since she does not have a thyroid fxn - she will likely need to return in 5-6 weeks for repeat labs. After next TFTs, may need Cytomel if still fatigued. - I will see her back in 4 months   Component     Latest Ref Rng 05/29/2015  TSH     0.35 - 4.50 uIU/mL 3.26  T4,Free(Direct)     0.60 - 1.60 ng/dL [id number]  Triiodothyronine,Free,Serum     2.3 - 4.2 pg/mL 3.1  Thyroperoxidase Ab SerPl-aCnc     <9 IU/mL 3   No sign of Hashimoto's thyroiditis at this point.  TFTs are normal, However, I believe the TSH will improve after she moves Synthroid in the morning.

## 2015-05-30 LAB — THYROID PEROXIDASE ANTIBODY: THYROID PEROXIDASE ANTIBODY: 3 [IU]/mL (ref <9)

## 2015-08-19 ENCOUNTER — Ambulatory Visit (INDEPENDENT_AMBULATORY_CARE_PROVIDER_SITE_OTHER): Payer: BLUE CROSS/BLUE SHIELD | Admitting: Family Medicine

## 2015-08-19 ENCOUNTER — Encounter: Payer: Self-pay | Admitting: Family Medicine

## 2015-08-19 VITALS — BP 122/84 | HR 56 | Temp 98.0°F | Resp 18 | Ht 64.5 in | Wt 183.8 lb

## 2015-08-19 DIAGNOSIS — Z1239 Encounter for other screening for malignant neoplasm of breast: Secondary | ICD-10-CM

## 2015-08-19 DIAGNOSIS — N920 Excessive and frequent menstruation with regular cycle: Secondary | ICD-10-CM

## 2015-08-19 DIAGNOSIS — E038 Other specified hypothyroidism: Secondary | ICD-10-CM | POA: Diagnosis not present

## 2015-08-19 DIAGNOSIS — Z13220 Encounter for screening for lipoid disorders: Secondary | ICD-10-CM | POA: Diagnosis not present

## 2015-08-19 DIAGNOSIS — Z0000 Encounter for general adult medical examination without abnormal findings: Secondary | ICD-10-CM

## 2015-08-19 DIAGNOSIS — E034 Atrophy of thyroid (acquired): Secondary | ICD-10-CM

## 2015-08-19 DIAGNOSIS — Z114 Encounter for screening for human immunodeficiency virus [HIV]: Secondary | ICD-10-CM

## 2015-08-19 DIAGNOSIS — Z131 Encounter for screening for diabetes mellitus: Secondary | ICD-10-CM

## 2015-08-19 LAB — CBC WITH DIFFERENTIAL/PLATELET
BASOS ABS: 0 {cells}/uL (ref 0–200)
BASOS PCT: 0 %
EOS PCT: 3 %
Eosinophils Absolute: 243 cells/uL (ref 15–500)
HCT: 43.9 % (ref 35.0–45.0)
Hemoglobin: 15 g/dL (ref 11.7–15.5)
LYMPHS ABS: 2835 {cells}/uL (ref 850–3900)
Lymphocytes Relative: 35 %
MCH: 28.9 pg (ref 27.0–33.0)
MCHC: 34.2 g/dL (ref 32.0–36.0)
MCV: 84.6 fL (ref 80.0–100.0)
MONOS PCT: 9 %
MPV: 11.2 fL (ref 7.5–12.5)
Monocytes Absolute: 729 cells/uL (ref 200–950)
NEUTROS ABS: 4293 {cells}/uL (ref 1500–7800)
Neutrophils Relative %: 53 %
PLATELETS: 316 10*3/uL (ref 140–400)
RBC: 5.19 MIL/uL — AB (ref 3.80–5.10)
RDW: 13.9 % (ref 11.0–15.0)
WBC: 8.1 10*3/uL (ref 3.8–10.8)

## 2015-08-19 LAB — POCT URINALYSIS DIP (MANUAL ENTRY)
Bilirubin, UA: NEGATIVE
Blood, UA: NEGATIVE
Glucose, UA: NEGATIVE
Ketones, POC UA: NEGATIVE
NITRITE UA: NEGATIVE
PH UA: 5.5
PROTEIN UA: NEGATIVE
Spec Grav, UA: 1.02
UROBILINOGEN UA: 0.2

## 2015-08-19 LAB — COMPREHENSIVE METABOLIC PANEL
ALT: 10 U/L (ref 6–29)
AST: 14 U/L (ref 10–30)
Albumin: 4.2 g/dL (ref 3.6–5.1)
Alkaline Phosphatase: 59 U/L (ref 33–115)
BILIRUBIN TOTAL: 0.5 mg/dL (ref 0.2–1.2)
BUN: 11 mg/dL (ref 7–25)
CHLORIDE: 106 mmol/L (ref 98–110)
CO2: 22 mmol/L (ref 20–31)
CREATININE: 0.83 mg/dL (ref 0.50–1.10)
Calcium: 9.6 mg/dL (ref 8.6–10.2)
Glucose, Bld: 86 mg/dL (ref 65–99)
Potassium: 4.7 mmol/L (ref 3.5–5.3)
SODIUM: 138 mmol/L (ref 135–146)
TOTAL PROTEIN: 7.2 g/dL (ref 6.1–8.1)

## 2015-08-19 LAB — LIPID PANEL
CHOLESTEROL: 216 mg/dL — AB (ref 125–200)
HDL: 53 mg/dL (ref >=46)
LDL CALC: 141 mg/dL — AB (ref <130)
Total CHOL/HDL Ratio: 4.1 Ratio (ref <=5.0)
Triglycerides: 108 mg/dL (ref <150)
VLDL: 22 mg/dL (ref <30)

## 2015-08-19 LAB — HEMOGLOBIN A1C
HEMOGLOBIN A1C: 5.5 % (ref <5.7)
MEAN PLASMA GLUCOSE: 111 mg/dL

## 2015-08-19 NOTE — Patient Instructions (Addendum)
   IF you received an x-ray today, you will receive an invoice from [facility]. Please contact [facility] at [phone] with questions or concerns regarding your invoice.   IF you received labwork today, you will receive an invoice from [facility]. Please contact [facility] at [phone] with questions or concerns regarding your invoice.   Our billing staff will not be able to assist you with questions regarding bills from these companies.  You will be contacted with the lab results as soon as they are available. The fastest way to get your results is to activate your My Chart account. Instructions are located on the last page of this paperwork. If you have not heard from us regarding the results in 2 weeks, please contact this office.     Keeping You Healthy  Get These Tests 1. Blood Pressure- Have your blood pressure checked once a year by your health care provider.  Normal blood pressure is 120/80. 2. Weight- Have your body mass index (BMI) calculated to screen for obesity.  BMI is measure of body fat based on height and weight.  You can also calculate your own BMI at www.nhlbisupport.com/bmi/. 3. Cholesterol- Have your cholesterol checked every 5 years starting at age 20 then yearly starting at age 45. 4. Chlamydia, HIV, and other sexually transmitted diseases- Get screened every year until age 25, then within three months of each new sexual provider. 5. Pap Test - Every 1-5 years; discuss with your health care provider. 6. Mammogram- Every 1-2 years starting at age 40--50  Take these medicines  Calcium with Vitamin D-Your body needs 1200 mg of Calcium each day and 800-1000 IU of Vitamin D daily.  Your body can only absorb 500 mg of Calcium at a time so Calcium must be taken in 2 or 3 divided doses throughout the day.  Multivitamin with folic acid- Once daily if it is possible for you to become pregnant.  Get these  Immunizations  Gardasil-Series of three doses; prevents HPV related illness such as genital warts and cervical cancer.  Menactra-Single dose; prevents meningitis.  Tetanus shot- Every 10 years.  Flu shot-Every year.  Take these steps 1. Do not smoke-Your healthcare provider can help you quit.  For tips on how to quit go to www.smokefree.gov or call 1-800 QUITNOW. 2. Be physically active- Exercise 5 days a week for at least 30 minutes.  If you are not already physically active, start slow and gradually work up to 30 minutes of moderate physical activity.  Examples of moderate activity include walking briskly, dancing, swimming, bicycling, etc. 3. Breast Cancer- A self breast exam every month is important for early detection of breast cancer.  For more information and instruction on self breast exams, ask your healthcare provider or www.womenshealth.gov/faq/breast-self-exam.cfm. 4. Eat a healthy diet- Eat a variety of healthy foods such as fruits, vegetables, whole grains, low fat milk, low fat cheeses, yogurt, lean meats, poultry and fish, beans, nuts, tofu, etc.  For more information go to www. Thenutritionsource.org 5. Drink alcohol in moderation- Limit alcohol intake to one drink or less per day. Never drink and drive. 6. Depression- Your emotional health is as important as your physical health.  If you're feeling down or losing interest in things you normally enjoy please talk to your healthcare provider about being screened for depression. 7. Dental visit- Brush and floss your teeth twice daily; visit your dentist twice a year. 8. Eye doctor- Get an eye exam at least every   2 years. 9. Helmet use- Always wear a helmet when riding a bicycle, motorcycle, rollerblading or skateboarding. 10. Safe sex- If you may be exposed to sexually transmitted infections, use a condom. 11. Seat belts- Seat belts can save your live; always wear one. 12. Smoke/Carbon Monoxide detectors- These detectors need to  be installed on the appropriate level of your home. Replace batteries at least once a year. 13. Skin cancer- When out in the sun please cover up and use sunscreen 15 SPF or higher. 14. Violence- If anyone is threatening or hurting you, please tell your healthcare provider.        

## 2015-08-19 NOTE — Progress Notes (Signed)
Subjective:    Patient ID: Rebecca Meza, female    DOB: 04/08/1975, 40 y.o.   MRN: [id number]  08/19/2015  Annual Exam   HPI This 40 y.o. female presents for Complete Physical Examination.  Last physical:  08-02-2013 Pap smear: 07-23-2013 WNL PHV negative; no gynecologist. Mammogram: never TDAP:  2012 Influenza:  2015 Eye exam:   Dental exam:   Menorrhagia: menses are starting to change; heavier and last longer.  Bleeds normal menses 5 days; other months 6-7 days.  Would be nice not to worry about menses.  No gynecologist. [facility] female only.  Hypothyroidism:  Patient reports good compliance with medication, good tolerance to medication, and good symptom control.  S/p evaluation by Gherghe in 05/2015.  Returns to see her in the upcoming month.    Review of Systems  Constitutional: Negative for activity change, appetite change, chills, diaphoresis, fatigue, fever and unexpected weight change.  HENT: Negative for congestion, dental problem, drooling, ear discharge, ear pain, facial swelling, hearing loss, mouth sores, nosebleeds, postnasal drip, rhinorrhea, sinus pressure, sneezing, sore throat, tinnitus, trouble swallowing and voice change.   Eyes: Negative for photophobia, pain, discharge, redness, itching and visual disturbance.  Respiratory: Negative for apnea, cough, choking, chest tightness, shortness of breath, wheezing and stridor.   Cardiovascular: Negative for chest pain, palpitations and leg swelling.  Gastrointestinal: Negative for abdominal distention, abdominal pain, anal bleeding, blood in stool, constipation, diarrhea, nausea, rectal pain and vomiting.  Endocrine: Negative for cold intolerance, heat intolerance, polydipsia, polyphagia and polyuria.  Genitourinary: Positive for menstrual problem. Negative for decreased urine volume, difficulty urinating, dyspareunia, dysuria, enuresis, flank pain, frequency, genital sores, hematuria, pelvic pain, urgency,  vaginal bleeding, vaginal discharge and vaginal pain.  Musculoskeletal: Negative for arthralgias, back pain, gait problem, joint swelling, myalgias, neck pain and neck stiffness.  Skin: Negative for color change, pallor, rash and wound.  Allergic/Immunologic: Negative for environmental allergies, food allergies and immunocompromised state.  Neurological: Negative for dizziness, tremors, seizures, syncope, facial asymmetry, speech difficulty, weakness, light-headedness, numbness and headaches.  Hematological: Negative for adenopathy. Does not bruise/bleed easily.  Psychiatric/Behavioral: Negative for agitation, behavioral problems, confusion, decreased concentration, dysphoric mood, hallucinations, self-injury, sleep disturbance and suicidal ideas. The patient is not nervous/anxious and is not hyperactive.     Past Medical History:  Diagnosis Date  . Allergy    oral antihistamine; seasonal.  . Anxiety   . Depression   . Thyroid disease 01/19/2003   hypothyroidism   Past Surgical History:  Procedure Laterality Date  . TONSILLECTOMY     No Known Allergies Current Outpatient Prescriptions  Medication Sig Dispense Refill  . Multiple Vitamin (MULTIVITAMIN) tablet Take 1 tablet by mouth daily.    . Omega-3 Fatty Acids (OMEGA 3 PO) Take 2 each by mouth daily.    Marland Kitchen SYNTHROID 150 MCG tablet Take 150 mcg by mouth at bedtime.     No current facility-administered medications for this visit.    Social History   Social History  . Marital status: Married    Spouse name: N/A  . Number of children: N/A  . Years of education: N/A   Occupational History  . Not on file.   Social History Main Topics  . Smoking status: Never Smoker  . Smokeless tobacco: Never Used  . Alcohol use No  . Drug use: No  . Sexual activity: Yes    Birth control/ protection: Condom   Other Topics Concern  . Not on file   Social History Narrative  Marital status: married x 11 years; happily married; no abuse;  moved from [place] in 2014.      Children two children/sons (10, 5)      Lives: with husband, two children/sons.      Employment:  Homemaker      Tobacco: none      Alcohol:  Socially; 2-3 glasses of wine.      Drugs: none      Exercise:   Swimming.        Seatbelt:  100%; no texting      Guns:  None      Sunscreen: SPF 50-70   Family History  Problem Relation Age of Onset  . Cancer Father 72    pancreatic cancer  . Hypothyroidism Mother        Objective:    BP 122/84 (BP Location: Right Arm, Patient Position: Sitting, Cuff Size: Large)   Pulse (!) 56   Temp 98 F (36.7 C) (Oral)   Resp 18   Ht 5' 4.5" (1.638 m)   Wt 183 lb 12.8 oz (83.4 kg)   SpO2 99%   BMI 31.06 kg/m  Physical Exam  Constitutional: She is oriented to person, place, and time. She appears well-developed and well-nourished. No distress.  HENT:  Head: Normocephalic and atraumatic.  Right Ear: External ear normal.  Left Ear: External ear normal.  Nose: Nose normal.  Mouth/Throat: Oropharynx is clear and moist.  Eyes: Conjunctivae and EOM are normal. Pupils are equal, round, and reactive to light.  Neck: Normal range of motion and full passive range of motion without pain. Neck supple. No JVD present. Carotid bruit is not present. No thyromegaly present.  Cardiovascular: Normal rate, regular rhythm and normal heart sounds.  Exam reveals no gallop and no friction rub.   No murmur heard. Pulmonary/Chest: Effort normal and breath sounds normal. She has no wheezes. She has no rales. Right breast exhibits no inverted nipple, no mass, no nipple discharge, no skin change and no tenderness. Left breast exhibits no inverted nipple, no mass, no nipple discharge, no skin change and no tenderness. Breasts are symmetrical.  Abdominal: Soft. Bowel sounds are normal. She exhibits no distension and no mass. There is no tenderness. There is no rebound and no guarding.  Musculoskeletal:       Right shoulder: Normal.        Left shoulder: Normal.       Cervical back: Normal.  Lymphadenopathy:    She has no cervical adenopathy.  Neurological: She is alert and oriented to person, place, and time. She has normal reflexes. No cranial nerve deficit. She exhibits normal muscle tone. Coordination normal.  Skin: Skin is warm and dry. No rash noted. She is not diaphoretic. No erythema. No pallor.  Psychiatric: She has a normal mood and affect. Her behavior is normal. Judgment and thought content normal.  Nursing note and vitals reviewed.  Results for orders placed or performed in visit on 08/19/15  CBC with Differential/Platelet  Result Value Ref Range   WBC 8.1 3.8 - 10.8 K/uL   RBC 5.19 (H) 3.80 - 5.10 MIL/uL   Hemoglobin 15.0 11.7 - 15.5 g/dL   HCT 43.9 35.0 - 45.0 %   MCV 84.6 80.0 - 100.0 fL   MCH 28.9 27.0 - 33.0 pg   MCHC 34.2 32.0 - 36.0 g/dL   RDW 13.9 11.0 - 15.0 %   Platelets 316 140 - 400 K/uL   MPV 11.2 7.5 - 12.5 fL  Neutro Abs 4,293 1,500 - 7,800 cells/uL   Lymphs Abs 2,835 850 - 3,900 cells/uL   Monocytes Absolute 729 200 - 950 cells/uL   Eosinophils Absolute 243 15 - 500 cells/uL   Basophils Absolute 0 0 - 200 cells/uL   Neutrophils Relative % 53 %   Lymphocytes Relative 35 %   Monocytes Relative 9 %   Eosinophils Relative 3 %   Basophils Relative 0 %   Smear Review Criteria for review not met   Comprehensive metabolic panel  Result Value Ref Range   Sodium 138 135 - 146 mmol/L   Potassium 4.7 3.5 - 5.3 mmol/L   Chloride 106 98 - 110 mmol/L   CO2 22 20 - 31 mmol/L   Glucose, Bld 86 65 - 99 mg/dL   BUN 11 7 - 25 mg/dL   Creat 0.83 0.50 - 1.10 mg/dL   Total Bilirubin 0.5 0.2 - 1.2 mg/dL   Alkaline Phosphatase 59 33 - 115 U/L   AST 14 10 - 30 U/L   ALT 10 6 - 29 U/L   Total Protein 7.2 6.1 - 8.1 g/dL   Albumin 4.2 3.6 - 5.1 g/dL   Calcium 9.6 8.6 - 10.2 mg/dL  Lipid panel  Result Value Ref Range   Cholesterol 216 (H) 125 - 200 mg/dL   Triglycerides 108 <150 mg/dL   HDL 53 >=46  mg/dL   Total CHOL/HDL Ratio 4.1 <=5.0 Ratio   VLDL 22 <30 mg/dL   LDL Cholesterol 141 (H) <130 mg/dL  HIV antibody  Result Value Ref Range   HIV 1&2 Ab, 4th Generation NONREACTIVE NONREACTIVE  Hemoglobin A1c  Result Value Ref Range   Hgb A1c MFr Bld 5.5 <5.7 %   Mean Plasma Glucose 111 mg/dL  POCT urinalysis dipstick  Result Value Ref Range   Color, UA yellow yellow   Clarity, UA clear clear   Glucose, UA negative negative   Bilirubin, UA negative negative   Ketones, POC UA negative negative   Spec Grav, UA 1.020    Blood, UA negative negative   pH, UA 5.5    Protein Ur, POC negative negative   Urobilinogen, UA 0.2    Nitrite, UA Negative Negative   Leukocytes, UA Trace (A) Negative       Assessment & Plan:   1. Routine physical examination   2. Hypothyroidism due to acquired atrophy of thyroid   3. Screening for diabetes mellitus   4. Screening, lipid   5. Screening for HIV (human immunodeficiency virus)   6. Menorrhagia with regular cycle   7. Encounter for breast cancer screening other than mammogram    -anticipatory guidance -obtain age appropriate screening labs. -refer to gynecology to evaluate for menorrhagia. -refer for mammogram.  Orders Placed This Encounter  Procedures  . CBC with Differential/Platelet  . Comprehensive metabolic panel    Order Specific Question:   Has the patient fasted?    Answer:   Yes  . Lipid panel    Order Specific Question:   Has the patient fasted?    Answer:   Yes  . HIV antibody  . Hemoglobin A1c  . Ambulatory referral to Gynecology    Referral Priority:   Routine    Referral Type:   Consultation    Referral Reason:   Specialty Services Required    Requested Specialty:   Gynecology    Number of Visits Requested:   1  . Care order/instruction:    AVS printed - let patient  go!  . POCT urinalysis dipstick   No orders of the defined types were placed in this encounter.   No Follow-up on file.   Kahli Mayon Elayne Guerin, M.D. [facility] [address] [place], [place]  [address] [phone] phone [phone] fax

## 2015-08-20 ENCOUNTER — Other Ambulatory Visit: Payer: Self-pay | Admitting: Family Medicine

## 2015-08-20 DIAGNOSIS — Z1231 Encounter for screening mammogram for malignant neoplasm of breast: Secondary | ICD-10-CM

## 2015-08-20 LAB — HIV ANTIBODY (ROUTINE TESTING W REFLEX): HIV 1&2 Ab, 4th Generation: NONREACTIVE

## 2015-09-29 ENCOUNTER — Encounter: Payer: Self-pay | Admitting: Internal Medicine

## 2015-09-29 ENCOUNTER — Ambulatory Visit (INDEPENDENT_AMBULATORY_CARE_PROVIDER_SITE_OTHER): Payer: BLUE CROSS/BLUE SHIELD | Admitting: Internal Medicine

## 2015-09-29 VITALS — BP 112/62 | HR 57 | Wt 184.0 lb

## 2015-09-29 DIAGNOSIS — E039 Hypothyroidism, unspecified: Secondary | ICD-10-CM | POA: Diagnosis not present

## 2015-09-29 LAB — TSH: TSH: 0.45 u[IU]/mL (ref 0.35–4.50)

## 2015-09-29 LAB — T4, FREE: FREE T4: 1.26 ng/dL (ref 0.60–1.60)

## 2015-09-29 NOTE — Patient Instructions (Addendum)
Please stop at the lab.  Please continue Synthroid 150 mcg daily.  Take the thyroid hormone every day, with water, at least 30 minutes before breakfast, separated by at least 4 hours from: - acid reflux medications - calcium - iron - multivitamins  Please come back for a follow-up appointment in 1 year, but in 6 months for labs.

## 2015-09-29 NOTE — Progress Notes (Signed)
Patient ID: Rebecca Meza, female   DOB: 08/19/1975, 40 y.o.   MRN: [id number]   HPI  Rebecca HausenSuzanne E Mcanany is a 40 y.o.-year-old female, returning for follow-up for hypothyroidism. Last visit 4 months ago.  Reviewed history: Pt. has been dx with hypothyroidism in 2004; she was started on Levothyroxine (LT4) - but did not feel good (fatigue, "sluggish"); then Armour (but TFTs difficult to control), then Synthroid in 2005.  She takes LT4: - prev. at night >> at last visit, we moved this in am, fasting - with water - no calcium, iron, PPIs - + multivitamins at night  I reviewed pt's thyroid tests: Lab Results  Component Value Date   TSH 3.26 05/29/2015   TSH 4.365 09/09/2014   TSH 4.521 (H) 12/31/2013   TSH 0.988 07/23/2013   TSH 5.711 (H) 04/18/2013   TSH 15.737 (H) 01/18/2013   FREET4 0.87 05/29/2015   FREET4 1.03 09/09/2014   FREET4 1.12 12/31/2013   FREET4 1.14 07/23/2013   FREET4 1.00 04/18/2013   FREET4 0.63 (L) 01/18/2013    Component     Latest Ref Rng 05/29/2015  Thyroperoxidase Ab SerPl-aCnc     <9 IU/mL 3   Pt describes: - no weight gain - + slight fatigue - no cold intolerance - no depression - +  mild constipation - no dry skin - no hair loss  Pt denies feeling nodules in neck, hoarseness, + dysphagia (when nervous and stressed)/no odynophagia, SOB with lying down.  She has + FH of thyroid disorders in: hypothyroidism in mother and possible MGM. No FH of thyroid cancer.  No h/o radiation tx to head or neck.  No Biotin.   ROS: Constitutional: + see HPI Eyes: no blurry vision, no xerophthalmia ENT: no sore throat, no nodules palpated in throat, no dysphagia/odynophagia, no hoarseness Cardiovascular: no CP/SOB/palpitations/leg swelling Respiratory: no cough/SOB Gastrointestinal: no N/V/D/+ C/no heartburn Musculoskeletal: no muscle/no joint aches Skin: no rashes Neurological: no tremors/numbness/tingling/dizziness, + HA  I reviewed pt's medications,  allergies, PMH, social hx, family hx, and changes were documented in the history of present illness. Otherwise, unchanged from my initial visit note:  Past Medical History:  Diagnosis Date  . Allergy    oral antihistamine; seasonal.  . Anxiety   . Depression   . Thyroid disease 01/19/2003   hypothyroidism   Past Surgical History:  Procedure Laterality Date  . TONSILLECTOMY     Social History   Social History Main Topics  . Smoking status: Never Smoker   . Smokeless tobacco: Never Used  . Alcohol Use: No  . Drug Use: No  . Sexual Activity: Yes    Birth Control/ Protection: Condom   Other Topics Concern  . Not on file   Social History Narrative   Marital status: married; no abuse; moved [place].      Children two children (8,3)      Lives: with husband, two children.      Employment:  Homemaker      Tobacco: none      Alcohol:  Socially      Drugs: none      Exercise:  Running every other day 2 miles alternating with 3 miles.       Seatbelt:  100%      Guns:  None      Sunscreen: SPF 50-70   Current Outpatient Prescriptions on File Prior to Visit  Medication Sig Dispense Refill  . Multiple Vitamin (MULTIVITAMIN) tablet Take 1 tablet  by mouth daily.    . Omega-3 Fatty Acids (OMEGA 3 PO) Take 2 each by mouth daily.    Marland Kitchen SYNTHROID 150 MCG tablet Take 150 mcg by mouth at bedtime.     No current facility-administered medications on file prior to visit.    No Known Allergies Family History  Problem Relation Age of Onset  . Cancer Father 10    pancreatic cancer  . Hypothyroidism Mother    PE: BP 112/62 (BP Location: Left Arm, Patient Position: Sitting)   Pulse (!) 57   Wt 184 lb (83.5 kg)   SpO2 98%   BMI 31.10 kg/m  Wt Readings from Last 3 Encounters:  09/29/15 184 lb (83.5 kg)  08/19/15 183 lb 12.8 oz (83.4 kg)  05/29/15 189 lb (85.7 kg)   Constitutional: overweight, in NAD Eyes: PERRLA, EOMI, no exophthalmos ENT: moist mucous membranes, no  thyromegaly, no cervical lymphadenopathy Cardiovascular: RRR, No MRG Respiratory: CTA B Gastrointestinal: abdomen soft, NT, ND, BS+ Musculoskeletal: no deformities, strength intact in all 4 Skin: moist, warm, no rashes Neurological: no tremor with outstretched hands, DTR normal in all 4  ASSESSMENT: 1. Hypothyroidism  PLAN:  1. Patient with long-standing hypothyroidism, on brand name Synthroid therapy, with fluctuating control. Her last TSH was normal. At last visit, she was taking the levothyroxine 150 g daily approximately 3 hours after dinner. We moved his to AM, fasting. - She appears euthyroid, with only mildo fatigue and constipation.  - will check thyroid tests today: TSH, free T4. - she continues to be interested in Cytomel (LT3), but not for now - We discussed about correct intake of levothyroxine, fasting, with water, separated by at least 30 minutes from breakfast, and separated by more than 4 hours from calcium, iron, multivitamins, acid reflux medications (PPIs). She is now taking it correctly - I will see her back in 1 year, but will check labs in 6 mo.  Needs future labs and refills.  Office Visit on 09/29/2015  Component Date Value Ref Range Status  . Free T4 09/29/2015 1.26  0.60 - 1.60 ng/dL Final  . TSH 16/10/9602 0.45  0.35 - 4.50 uIU/mL Final   TFTs improved after moving LT4 in am.   Carlus Pavlov, MD PhD [facility] Endocrinology

## 2015-09-30 ENCOUNTER — Ambulatory Visit
Admission: RE | Admit: 2015-09-30 | Discharge: 2015-09-30 | Disposition: A | Discharge status: Home / Self Care | Payer: BLUE CROSS/BLUE SHIELD | Source: Ambulatory Visit | Attending: Family Medicine | Admitting: Family Medicine

## 2015-09-30 DIAGNOSIS — Z1231 Encounter for screening mammogram for malignant neoplasm of breast: Secondary | ICD-10-CM

## 2015-10-01 ENCOUNTER — Other Ambulatory Visit: Payer: Self-pay | Admitting: Family Medicine

## 2015-10-01 DIAGNOSIS — R928 Other abnormal and inconclusive findings on diagnostic imaging of breast: Secondary | ICD-10-CM

## 2015-10-06 ENCOUNTER — Ambulatory Visit
Admission: RE | Admit: 2015-10-06 | Discharge: 2015-10-06 | Disposition: A | Discharge status: Home / Self Care | Payer: BLUE CROSS/BLUE SHIELD | Source: Ambulatory Visit | Attending: Family Medicine | Admitting: Family Medicine

## 2015-10-06 DIAGNOSIS — R928 Other abnormal and inconclusive findings on diagnostic imaging of breast: Secondary | ICD-10-CM

## 2015-10-14 ENCOUNTER — Other Ambulatory Visit: Payer: Self-pay | Admitting: Family Medicine

## 2015-10-16 ENCOUNTER — Encounter: Payer: Self-pay | Admitting: Internal Medicine

## 2015-10-16 ENCOUNTER — Other Ambulatory Visit: Payer: Self-pay

## 2015-10-16 MED ORDER — SYNTHROID 150 MCG PO TABS: 150.0000 ug | ORAL_TABLET | Freq: Every day | ORAL | 4 refills | Status: DC

## 2015-11-02 ENCOUNTER — Encounter: Payer: Self-pay | Admitting: Family Medicine

## 2016-03-29 ENCOUNTER — Other Ambulatory Visit (INDEPENDENT_AMBULATORY_CARE_PROVIDER_SITE_OTHER): Payer: BLUE CROSS/BLUE SHIELD

## 2016-03-29 ENCOUNTER — Ambulatory Visit: Payer: BLUE CROSS/BLUE SHIELD | Admitting: Internal Medicine

## 2016-03-29 DIAGNOSIS — E039 Hypothyroidism, unspecified: Secondary | ICD-10-CM

## 2016-03-30 LAB — TSH: TSH: 0.5 u[IU]/mL (ref 0.35–4.50)

## 2016-03-30 LAB — T4, FREE: Free T4: 1.25 ng/dL (ref 0.60–1.60)

## 2016-04-08 ENCOUNTER — Other Ambulatory Visit: Payer: Self-pay | Admitting: Internal Medicine

## 2016-08-23 ENCOUNTER — Other Ambulatory Visit: Payer: Self-pay | Admitting: Internal Medicine

## 2016-08-30 ENCOUNTER — Encounter: Payer: Self-pay | Admitting: Family Medicine

## 2016-08-30 DIAGNOSIS — F411 Generalized anxiety disorder: Secondary | ICD-10-CM

## 2016-09-10 ENCOUNTER — Ambulatory Visit (INDEPENDENT_AMBULATORY_CARE_PROVIDER_SITE_OTHER): Payer: BLUE CROSS/BLUE SHIELD | Admitting: Family Medicine

## 2016-09-10 ENCOUNTER — Encounter: Payer: Self-pay | Admitting: Family Medicine

## 2016-09-10 VITALS — BP 124/82 | HR 54 | Temp 98.2°F | Resp 16 | Ht 64.76 in | Wt 164.8 lb

## 2016-09-10 DIAGNOSIS — Z1239 Encounter for other screening for malignant neoplasm of breast: Secondary | ICD-10-CM

## 2016-09-10 DIAGNOSIS — Z0000 Encounter for general adult medical examination without abnormal findings: Secondary | ICD-10-CM

## 2016-09-10 DIAGNOSIS — Z131 Encounter for screening for diabetes mellitus: Secondary | ICD-10-CM | POA: Diagnosis not present

## 2016-09-10 DIAGNOSIS — R140 Abdominal distension (gaseous): Secondary | ICD-10-CM

## 2016-09-10 DIAGNOSIS — Z1231 Encounter for screening mammogram for malignant neoplasm of breast: Secondary | ICD-10-CM

## 2016-09-10 DIAGNOSIS — E034 Atrophy of thyroid (acquired): Secondary | ICD-10-CM | POA: Diagnosis not present

## 2016-09-10 DIAGNOSIS — Z124 Encounter for screening for malignant neoplasm of cervix: Secondary | ICD-10-CM

## 2016-09-10 DIAGNOSIS — Z13220 Encounter for screening for lipoid disorders: Secondary | ICD-10-CM

## 2016-09-10 DIAGNOSIS — Z23 Encounter for immunization: Secondary | ICD-10-CM

## 2016-09-10 DIAGNOSIS — F411 Generalized anxiety disorder: Secondary | ICD-10-CM | POA: Diagnosis not present

## 2016-09-10 LAB — POCT URINALYSIS DIP (MANUAL ENTRY)
Blood, UA: NEGATIVE
GLUCOSE UA: NEGATIVE mg/dL
NITRITE UA: NEGATIVE
Protein Ur, POC: NEGATIVE mg/dL
Spec Grav, UA: >=1.03 — AB (ref 1.010–1.025)
UROBILINOGEN UA: 0.2 U/dL
pH, UA: 5.5 (ref 5.0–8.0)

## 2016-09-10 MED ORDER — CITALOPRAM HYDROBROMIDE 20 MG PO TABS: 20.0000 mg | ORAL_TABLET | Freq: Every day | ORAL | 1 refills | Status: DC

## 2016-09-10 NOTE — Patient Instructions (Addendum)
   IF you received an x-ray today, you will receive an invoice from [facility]. Please contact [facility] at [phone] with questions or concerns regarding your invoice.   IF you received labwork today, you will receive an invoice from [facility]. Please contact [facility] at [phone] with questions or concerns regarding your invoice.   Our billing staff will not be able to assist you with questions regarding bills from these companies.  You will be contacted with the lab results as soon as they are available. The fastest way to get your results is to activate your My Chart account. Instructions are located on the last page of this paperwork. If you have not heard from us regarding the results in 2 weeks, please contact this office.      Preventive Care 18-39 Years, Female Preventive care refers to lifestyle choices and visits with your health care provider that can promote health and wellness. What does preventive care include?  A yearly physical exam. This is also called an annual well check.  Dental exams once or twice a year.  Routine eye exams. Ask your health care provider how often you should have your eyes checked.  Personal lifestyle choices, including:  Daily care of your teeth and gums.  Regular physical activity.  Eating a healthy diet.  Avoiding tobacco and drug use.  Limiting alcohol use.  Practicing safe sex.  Taking vitamin and mineral supplements as recommended by your health care provider. What happens during an annual well check? The services and screenings done by your health care provider during your annual well check will depend on your age, overall health, lifestyle risk factors, and family history of disease. Counseling  Your health care provider may ask you questions about your:  Alcohol use.  Tobacco use.  Drug use.  Emotional well-being.  Home and relationship well-being.  Sexual activity.  Eating  habits.  Work and work environment.  Method of birth control.  Menstrual cycle.  Pregnancy history. Screening  You may have the following tests or measurements:  Height, weight, and BMI.  Diabetes screening. This is done by checking your blood sugar (glucose) after you have not eaten for a while (fasting).  Blood pressure.  Lipid and cholesterol levels. These may be checked every 5 years starting at age 20.  Skin check.  Hepatitis C blood test.  Hepatitis B blood test.  Sexually transmitted disease (STD) testing.  BRCA-related cancer screening. This may be done if you have a family history of breast, ovarian, tubal, or peritoneal cancers.  Pelvic exam and Pap test. This may be done every 3 years starting at age 21. Starting at age 30, this may be done every 5 years if you have a Pap test in combination with an HPV test. Discuss your test results, treatment options, and if necessary, the need for more tests with your health care provider. Vaccines  Your health care provider may recommend certain vaccines, such as:  Influenza vaccine. This is recommended every year.  Tetanus, diphtheria, and acellular pertussis (Tdap, Td) vaccine. You may need a Td booster every 10 years.  Varicella vaccine. You may need this if you have not been vaccinated.  HPV vaccine. If you are 26 or younger, you may need three doses over 6 months.  Measles, mumps, and rubella (MMR) vaccine. You may need at least one dose of MMR. You may also need a second dose.  Pneumococcal 13-valent conjugate (PCV13) vaccine. You may need this if you have certain   conditions and were not previously vaccinated.  Pneumococcal polysaccharide (PPSV23) vaccine. You may need one or two doses if you smoke cigarettes or if you have certain conditions.  Meningococcal vaccine. One dose is recommended if you are age 19-21 years and a first-year college student living in a residence hall, or if you have one of several  medical conditions. You may also need additional booster doses.  Hepatitis A vaccine. You may need this if you have certain conditions or if you travel or work in places where you may be exposed to hepatitis A.  Hepatitis B vaccine. You may need this if you have certain conditions or if you travel or work in places where you may be exposed to hepatitis B.  Haemophilus influenzae type b (Hib) vaccine. You may need this if you have certain risk factors. Talk to your health care provider about which screenings and vaccines you need and how often you need them. This information is not intended to replace advice given to you by your health care provider. Make sure you discuss any questions you have with your health care provider. Document Released: 03/02/2001 Document Revised: 09/24/2015 Document Reviewed: 11/05/2014 Elsevier Interactive Patient Education  2017 Elsevier Inc.  

## 2016-09-10 NOTE — Progress Notes (Signed)
Subjective:    Patient ID: Rebecca Meza, female    DOB: 1975/04/09, 41 y.o.   MRN: [id number]  HPI This 41 y.o. female presents for Complete Physical Examination.  Last physical:  08-19-2015 Pap smear:  07-23-2013 Mammogram:  10-06-2015 Eye exam: 2016; no contacts; +glasses Dental exam:  Several years  Decided against ablation.  Menses have lightened up with weight loss.  Regular menses.  Weight down twenty pounds.  Still working on weight loss; weight loss getting harder.  Would like to lose 25 more pounds.  Seeing endocrinology on 09/29/2015.    Abdominal bloating: occurs with eating with pain radiating to back; denies nausea, vomiting, diarrhea.  Intermittent reflux; excessive gas.  Denies bloody stools or black stools.  Has lost 20 pounds intentionally since last visit.    Anxiety and concern for ADHD: anxiety has acutely worsened over the summer; no trigger; son just diagnosed with ADHD; pt has always struggled to stay on task through school and especially college; desires to undergo evaluation for ADHD.  Denies SI; +insomnia.  MyChart message requested psychiatry referral in the past few weeks.     BP Readings from Last 3 Encounters:  09/10/16 124/82  09/29/15 112/62  08/19/15 122/84   Wt Readings from Last 3 Encounters:  09/10/16 164 lb 12.8 oz (74.8 kg)  09/29/15 184 lb (83.5 kg)  08/19/15 183 lb 12.8 oz (83.4 kg)   Immunization History  Administered Date(s) Administered  . Influenza,inj,Quad PF,6+ Mos 01/18/2013, 12/31/2013, 09/10/2016  . Tdap 01/18/2010    Visual Acuity Screening   Right eye Left eye Both eyes  Without correction: 20/20 20/20 20/20   With correction:        Review of Systems  Constitutional: Negative for activity change, appetite change, chills, diaphoresis, fatigue, fever and unexpected weight change.  HENT: Negative for congestion, dental problem, drooling, ear discharge, ear pain, facial swelling, hearing loss, mouth sores, nosebleeds,  postnasal drip, rhinorrhea, sinus pressure, sneezing, sore throat, tinnitus, trouble swallowing and voice change.   Eyes: Negative for photophobia, pain, discharge, redness, itching and visual disturbance.  Respiratory: Negative for apnea, cough, choking, chest tightness, shortness of breath, wheezing and stridor.   Cardiovascular: Negative for chest pain, palpitations and leg swelling.  Gastrointestinal: Positive for abdominal pain and constipation. Negative for abdominal distention, anal bleeding, blood in stool, diarrhea, nausea, rectal pain and vomiting.  Endocrine: Negative for cold intolerance, heat intolerance, polydipsia, polyphagia and polyuria.  Genitourinary: Negative for decreased urine volume, difficulty urinating, dyspareunia, dysuria, enuresis, flank pain, frequency, genital sores, hematuria, menstrual problem, pelvic pain, urgency, vaginal bleeding, vaginal discharge and vaginal pain.  Musculoskeletal: Positive for back pain. Negative for arthralgias, gait problem, joint swelling, myalgias, neck pain and neck stiffness.  Skin: Negative for color change, pallor, rash and wound.  Allergic/Immunologic: Negative for environmental allergies, food allergies and immunocompromised state.  Neurological: Negative for dizziness, tremors, seizures, syncope, facial asymmetry, speech difficulty, weakness, light-headedness, numbness and headaches.  Hematological: Negative for adenopathy. Does not bruise/bleed easily.  Psychiatric/Behavioral: Negative for agitation, behavioral problems, confusion, decreased concentration, dysphoric mood, hallucinations, self-injury, sleep disturbance and suicidal ideas. The patient is nervous/anxious. The patient is not hyperactive.        Bedtime 9:00; wakes up 5:00.    Past Medical History:  Diagnosis Date  . Allergy    oral antihistamine; seasonal.  . Anxiety   . Depression   . Thyroid disease 01/19/2003   hypothyroidism   Past Surgical History:  Procedure  Laterality Date  .  TONSILLECTOMY     No Known Allergies Social History   Social History  . Marital status: Married    Spouse name: N/A  . Number of children: N/A  . Years of education: N/A   Occupational History  . Not on file.   Social History Main Topics  . Smoking status: Never Smoker  . Smokeless tobacco: Never Used  . Alcohol use No  . Drug use: No  . Sexual activity: Yes    Birth control/ protection: Condom   Other Topics Concern  . Not on file   Social History Narrative   Marital status: married x 12 years; happily married; no abuse; moved from [place] in 2014.      Children two children/sons (11, 6)      Lives: with husband, two children/sons.      Employment:  Homemaker      Tobacco: none      Alcohol:  Socially; 2-3 glasses of wine.      Drugs: none      Exercise:   Swimming. Sporadic.       Seatbelt:  100%; no texting      Guns:  None      Sunscreen: SPF 50-70   Family History  Problem Relation Age of Onset  . Cancer Father 17       pancreatic cancer  . Hypothyroidism Mother        Objective:   Physical Exam  Constitutional: She is oriented to person, place, and time. She appears well-developed and well-nourished. No distress.  HENT:  Head: Normocephalic and atraumatic.  Right Ear: External ear normal.  Left Ear: External ear normal.  Nose: Nose normal.  Mouth/Throat: Oropharynx is clear and moist.  Eyes: Pupils are equal, round, and reactive to light. Conjunctivae and EOM are normal.  Neck: Normal range of motion and full passive range of motion without pain. Neck supple. No JVD present. Carotid bruit is not present. No thyromegaly present.  Cardiovascular: Normal rate, regular rhythm and normal heart sounds.  Exam reveals no gallop and no friction rub.   No murmur heard. Pulmonary/Chest: Effort normal and breath sounds normal. She has no wheezes. She has no rales.     Right breast exhibits no inverted nipple, no mass, no nipple discharge, no  skin change and no tenderness. Left breast exhibits no inverted nipple, no mass, no nipple discharge, no skin change and no tenderness. Breasts are symmetrical.  Abdominal: Soft. Bowel sounds are normal. She exhibits no distension and no mass. There is no tenderness. There is no rebound and no guarding.  Genitourinary: Vagina normal and uterus normal. There is no rash, tenderness, lesion or injury on the right labia. There is no rash, tenderness, lesion or injury on the left labia. Cervix exhibits no motion tenderness, no discharge and no friability. Right adnexum displays no mass, no tenderness and no fullness. Left adnexum displays no mass, no tenderness and no fullness.  Musculoskeletal:       Right shoulder: Normal.       Left shoulder: Normal.       Cervical back: Normal.  Lymphadenopathy:    She has no cervical adenopathy.  Neurological: She is alert and oriented to person, place, and time. She has normal reflexes. No cranial nerve deficit. She exhibits normal muscle tone. Coordination normal.  Skin: Skin is warm and dry. No rash noted. She is not diaphoretic. No erythema. No pallor.  Diffuse skin changes.  Psychiatric: She has a normal mood and  affect. Her behavior is normal. Judgment and thought content normal.  Nursing note and vitals reviewed.  Depression screen [facility] 2/9 09/10/2016 08/19/2015 04/23/2013  Decreased Interest 0 0 0  Down, Depressed, Hopeless 0 0 2  PHQ - 2 Score 0 0 2  Altered sleeping - - 1  Tired, decreased energy - - 3  Change in appetite - - 2  Feeling bad or failure about yourself  - - 0  Trouble concentrating - - 3  Moving slowly or fidgety/restless - - 0  Suicidal thoughts - - 0  PHQ-9 Score - - 11   Fall Risk  09/10/2016 08/19/2015 04/23/2013  Falls in the past year? No No No       Assessment & Plan:   1. Routine physical examination   2. Screening, lipid   3. Screening for diabetes mellitus   4. Hypothyroidism due to acquired atrophy of thyroid   5.  Anxiety state   6. Screening for cervical cancer   7. Screening for breast cancer   8. Bloating   9. Need for prophylactic vaccination and inoculation against influenza    -anticipatory guidance provided --- exercise, weight loss, safe driving practices, calcium 600mg  bid. -obtain age appropriate screening labs and labs for chronic disease management. -congratulations on weight loss. -new onset abdominal bloating, back pain, gas; obtain CMET, abdominal ultrasound; pt worried about cholelithiasis.  -acute worsening anxiety in past year; also son recently diagnosed with ADHD; pt concerned of undiagnosed ADHD; referral to psychiatry placed last week for patient; pt also plans to schedule appointment with psychology for ADHD work up.  Rx for Citalopram provided to start now while awaiting consultations. -recommend undergoing dermatology evaluation due to skin changes.    Orders Placed This Encounter  Procedures  . US Abdomen Complete  . MM SCREENING BREAST TOMO BILATERAL  . Flu Vaccine QUAD 36+ mos IM  . CBC with Differential/Platelet  . Comprehensive metabolic panel  . Hemoglobin A1c  . Lipid panel  . Vitamin B12  . VITAMIN D 25 Hydroxy (Vit-D Deficiency, Fractures)  . POCT urinalysis dipstick   Meds ordered this encounter  Medications  . citalopram (CELEXA) 20 MG tablet    Sig: Take 1 tablet (20 mg total) by mouth daily.    Dispense:  90 tablet    Refill:  1   Cecilie Lowers, M.D. Primary Care at [facility] previously [facility] [address] [place], [place]  [address] [phone] phone [phone] fax

## 2016-09-11 LAB — CBC WITH DIFFERENTIAL/PLATELET
BASOS: 0 %
Basophils Absolute: 0 10*3/uL (ref 0.0–0.2)
EOS (ABSOLUTE): 0.2 10*3/uL (ref 0.0–0.4)
EOS: 3 %
HEMATOCRIT: 43.4 % (ref 34.0–46.6)
HEMOGLOBIN: 14.1 g/dL (ref 11.1–15.9)
IMMATURE GRANS (ABS): 0 10*3/uL (ref 0.0–0.1)
IMMATURE GRANULOCYTES: 0 %
LYMPHS: 22 %
Lymphocytes Absolute: 1.5 10*3/uL (ref 0.7–3.1)
MCH: 28.2 pg (ref 26.6–33.0)
MCHC: 32.5 g/dL (ref 31.5–35.7)
MCV: 87 fL (ref 79–97)
Monocytes Absolute: 0.8 10*3/uL (ref 0.1–0.9)
Monocytes: 11 %
NEUTROS ABS: 4.4 10*3/uL (ref 1.4–7.0)
Neutrophils: 64 %
Platelets: 327 10*3/uL (ref 150–379)
RBC: 5 x10E6/uL (ref 3.77–5.28)
RDW: 13.6 % (ref 12.3–15.4)
WBC: 7 10*3/uL (ref 3.4–10.8)

## 2016-09-11 LAB — COMPREHENSIVE METABOLIC PANEL
ALBUMIN: 4.5 g/dL (ref 3.5–5.5)
ALK PHOS: 69 IU/L (ref 39–117)
ALT: 8 IU/L (ref 0–32)
AST: 15 IU/L (ref 0–40)
Albumin/Globulin Ratio: 1.7 (ref 1.2–2.2)
BUN/Creatinine Ratio: 15 (ref 9–23)
BUN: 12 mg/dL (ref 6–24)
Bilirubin Total: 0.6 mg/dL (ref 0.0–1.2)
CALCIUM: 9.8 mg/dL (ref 8.7–10.2)
CO2: 18 mmol/L — AB (ref 20–29)
CREATININE: 0.82 mg/dL (ref 0.57–1.00)
Chloride: 104 mmol/L (ref 96–106)
GFR calc Af Amer: 103 mL/min/{1.73_m2} (ref >59)
GFR, EST NON AFRICAN AMERICAN: 89 mL/min/{1.73_m2} (ref >59)
GLUCOSE: 64 mg/dL — AB (ref 65–99)
Globulin, Total: 2.7 g/dL (ref 1.5–4.5)
Potassium: 4.9 mmol/L (ref 3.5–5.2)
Sodium: 139 mmol/L (ref 134–144)
Total Protein: 7.2 g/dL (ref 6.0–8.5)

## 2016-09-11 LAB — HEMOGLOBIN A1C
ESTIMATED AVERAGE GLUCOSE: 100 mg/dL
HEMOGLOBIN A1C: 5.1 % (ref 4.8–5.6)

## 2016-09-11 LAB — LIPID PANEL
Chol/HDL Ratio: 4.9 ratio — ABNORMAL HIGH (ref 0.0–4.4)
Cholesterol, Total: 232 mg/dL — ABNORMAL HIGH (ref 100–199)
HDL: 47 mg/dL (ref >39)
LDL Calculated: 167 mg/dL — ABNORMAL HIGH (ref 0–99)
TRIGLYCERIDES: 88 mg/dL (ref 0–149)
VLDL Cholesterol Cal: 18 mg/dL (ref 5–40)

## 2016-09-11 LAB — VITAMIN B12: Vitamin B-12: 509 pg/mL (ref 232–1245)

## 2016-09-11 LAB — VITAMIN D 25 HYDROXY (VIT D DEFICIENCY, FRACTURES): Vit D, 25-Hydroxy: 48.4 ng/mL (ref 30.0–100.0)

## 2016-09-13 LAB — PAP IG AND HPV HIGH-RISK
HPV, HIGH-RISK: NEGATIVE
PAP SMEAR COMMENT: 0

## 2016-09-16 ENCOUNTER — Encounter: Payer: Self-pay | Admitting: Family Medicine

## 2016-09-21 DIAGNOSIS — F900 Attention-deficit hyperactivity disorder, predominantly inattentive type: Secondary | ICD-10-CM | POA: Diagnosis not present

## 2016-09-21 DIAGNOSIS — F411 Generalized anxiety disorder: Secondary | ICD-10-CM | POA: Diagnosis not present

## 2016-09-28 ENCOUNTER — Ambulatory Visit (INDEPENDENT_AMBULATORY_CARE_PROVIDER_SITE_OTHER): Payer: BLUE CROSS/BLUE SHIELD | Admitting: Internal Medicine

## 2016-09-28 ENCOUNTER — Encounter: Payer: Self-pay | Admitting: Internal Medicine

## 2016-09-28 ENCOUNTER — Other Ambulatory Visit (INDEPENDENT_AMBULATORY_CARE_PROVIDER_SITE_OTHER): Payer: BLUE CROSS/BLUE SHIELD

## 2016-09-28 VITALS — BP 124/80 | HR 58 | Wt 162.0 lb

## 2016-09-28 DIAGNOSIS — E669 Obesity, unspecified: Secondary | ICD-10-CM | POA: Insufficient documentation

## 2016-09-28 DIAGNOSIS — E039 Hypothyroidism, unspecified: Secondary | ICD-10-CM

## 2016-09-28 DIAGNOSIS — E663 Overweight: Secondary | ICD-10-CM | POA: Diagnosis not present

## 2016-09-28 DIAGNOSIS — E66811 Obesity, class 1: Secondary | ICD-10-CM | POA: Insufficient documentation

## 2016-09-28 LAB — T4, FREE: Free T4: 1.64 ng/dL — ABNORMAL HIGH (ref 0.60–1.60)

## 2016-09-28 LAB — TSH: TSH: 0.06 u[IU]/mL — ABNORMAL LOW (ref 0.35–4.50)

## 2016-09-28 MED ORDER — SYNTHROID 137 MCG PO TABS: 137.0000 ug | ORAL_TABLET | Freq: Every day | ORAL | 1 refills | Status: DC

## 2016-09-28 NOTE — Patient Instructions (Signed)
Please stop at the lab.  Please continue Synthroid 150 mcg daily.  Take the thyroid hormone every day, with water, at least 30 minutes before breakfast, separated by at least 4 hours from: - acid reflux medications - calcium - iron - multivitamins  Please return in 1 year.   

## 2016-09-28 NOTE — Progress Notes (Signed)
Patient ID: Rebecca Meza, female   DOB: [birth year], 41 y.o.   MRN: [id number]   HPI  Rebecca Meza is a 41 y.o.-year-old female, returning for follow-up for hypothyroidism. Last visit 1 year ago.  Reviewed hx: Pt. has been dx with hypothyroidism in 2004; she was started on Levothyroxine (LT4) - but did not feel good (fatigue, "sluggish"); then Armour (but TFTs difficult to control), then Synthroid in 2005.  Pt is on levothyroxine DAW 150 mcg daily, taken: - in am now - fasting - at least 30 min from b'fast - no Ca, Fe, PPIs - + MVI at night - not on Biotin  I reviewed pt's thyroid tests - normal in last 2 years: Lab Results  Component Value Date   TSH 0.50 03/29/2016   TSH 0.45 09/29/2015   TSH 3.26 05/29/2015   TSH 4.365 09/09/2014   TSH 4.521 (H) 12/31/2013   TSH 0.988 07/23/2013   TSH 5.711 (H) 04/18/2013   TSH 15.737 (H) 01/18/2013   FREET4 1.25 03/29/2016   FREET4 1.26 09/29/2015   FREET4 0.87 05/29/2015   FREET4 1.03 09/09/2014   FREET4 1.12 12/31/2013   FREET4 1.14 07/23/2013   FREET4 1.00 04/18/2013   FREET4 0.63 (L) 01/18/2013    Component     Latest Ref Rng 05/29/2015  Thyroperoxidase Ab SerPl-aCnc     <9 IU/mL 3   Pt denies: - feeling nodules in neck - hoarseness - dysphagia - choking - SOB with lying down  She has + FH of thyroid disorders in: hypothyroidism in mother and possible MGM. No FH of thyroid cancer. No h/o radiation tx to head or neck.  No seaweed or kelp. No recent contrast studies. No herbal supplements. No Biotin use. No recent steroids use.   She started Celexa 20 mg for anxiety >> more fatigue  - starting to improve.   ROS: Constitutional: + intentional 20 lb weight loss, no fatigue, no subjective hyperthermia, no subjective hypothermia Eyes: no blurry vision, no xerophthalmia ENT: no sore throat, no nodules palpated in throat, no dysphagia, no odynophagia, no hoarseness Cardiovascular: no CP/no SOB/no palpitations/no leg  swelling Respiratory: no cough/no SOB/no wheezing Gastrointestinal: no N/no V/no D/no C/no acid reflux Musculoskeletal: no muscle aches/no joint aches Skin: no rashes, no hair loss Neurological: no tremors/no numbness/no tingling/no dizziness  I reviewed pt's medications, allergies, PMH, social hx, family hx, and changes were documented in the history of present illness. Otherwise, unchanged from my initial visit note.   Past Medical History:  Diagnosis Date  . Allergy    oral antihistamine; seasonal.  . Anxiety   . Depression   . Thyroid disease 01/19/2003   hypothyroidism   Past Surgical History:  Procedure Laterality Date  . TONSILLECTOMY     Social History   Social History Main Topics  . Smoking status: Never Smoker   . Smokeless tobacco: Never Used  . Alcohol Use: No  . Drug Use: No  . Sexual Activity: Yes    Birth Control/ Protection: Condom   Social History Narrative   Marital status: married; no abuse; moved from [place] in 2014.      Children two children (8,3)      Lives: with husband, two children.      Employment:  Homemaker      Tobacco: none      Alcohol:  Socially      Drugs: none      Exercise:  Running every other day 2 miles alternating with 3  miles.       Seatbelt:  100%      Guns:  None      Sunscreen: SPF 50-70   Current Outpatient Prescriptions on File Prior to Visit  Medication Sig Dispense Refill  . citalopram (CELEXA) 20 MG tablet Take 1 tablet (20 mg total) by mouth daily. 90 tablet 1  . SYNTHROID 150 MCG tablet TAKE ONE TABLET BY MOUTH EVERY NIGHT AT BEDTIME ON AN EMPTY STOMACH 30 tablet 2   No current facility-administered medications on file prior to visit.    No Known Allergies Family History  Problem Relation Age of Onset  . Cancer Father [id number]       pancreatic cancer  . Hypothyroidism Mother    PE: BP 124/80 (BP Location: Left Arm, Patient Position: Sitting)   Pulse (!) 58   Wt 162 lb (73.5 kg)   LMP 09/18/2016   SpO2 98%    BMI 27.16 kg/m  Wt Readings from Last 3 Encounters:  09/28/16 162 lb (73.5 kg)  09/10/16 164 lb 12.8 oz (74.8 kg)  09/29/15 184 lb (83.5 kg)   Constitutional: overweight, in NAD Eyes: PERRLA, EOMI, no exophthalmos ENT: moist mucous membranes, no thyromegaly, no cervical lymphadenopathy Cardiovascular: RRR, No MRG Respiratory: CTA B Gastrointestinal: abdomen soft, NT, ND, BS+ Musculoskeletal: no deformities, strength intact in all 4 Skin: moist, warm, no rashes Neurological: no tremor with outstretched hands, DTR normal in all 4  ASSESSMENT: 1. Hypothyroidism  2. Overweight  PLAN:  1. Patient with long-standing hypothyroidism, on brand name Synthroid therapy, with fluctuating control. Her TFTs are now normal after we moved LT4 from bedtime to am. - latest thyroid labs reviewed with pt >> normal. She feels well, lost >20 lbs through intermittent fasting >> 2x a week - she was interested in Cytomel (LT3) in the past, but not for now - will continue continue on LT4 DAW 150 mcg daily - pt feels good on this dose. - we discussed about taking the thyroid hormone every day, with water, >30 minutes before breakfast, separated by >4 hours from acid reflux medications, calcium, iron, multivitamins. Pt. is taking it correctly. - will check thyroid tests today: TSH and fT4 - If labs are abnormal, she will need to return for repeat TFTs in 1.5 months - OTW, RTC in 1 year, but may needs to check labs in 6 mo.  2. Overweight - intermittent fasting since last visit >> lost >20 lbs: drinks water, coffee only - congratulated her for the weight loss  Needs refills.   Component     Latest Ref Rng & Units 09/28/2016  TSH     0.35 - 4.50 uIU/mL 0.06 (L)  T4,Free(Direct)     0.60 - 1.60 ng/dL 4.091.64 (H)  TFTs are abnormal >> we need to decrease her LT4 dose to 137 mcg daily and have her back for labs in 1.5 mo.  Carlus Pavlovristina Hakeen Shipes, MD PhD [facility]

## 2016-10-07 ENCOUNTER — Ambulatory Visit
Admission: RE | Admit: 2016-10-07 | Discharge: 2016-10-07 | Disposition: A | Discharge status: Home / Self Care | Payer: BLUE CROSS/BLUE SHIELD | Source: Ambulatory Visit | Attending: Family Medicine | Admitting: Family Medicine

## 2016-10-07 ENCOUNTER — Encounter: Payer: Self-pay | Admitting: Family Medicine

## 2016-10-07 DIAGNOSIS — Z1239 Encounter for other screening for malignant neoplasm of breast: Secondary | ICD-10-CM

## 2016-10-07 DIAGNOSIS — Z1231 Encounter for screening mammogram for malignant neoplasm of breast: Secondary | ICD-10-CM | POA: Diagnosis not present

## 2016-10-11 ENCOUNTER — Ambulatory Visit
Admission: RE | Admit: 2016-10-11 | Discharge: 2016-10-11 | Disposition: A | Discharge status: Home / Self Care | Payer: BLUE CROSS/BLUE SHIELD | Source: Ambulatory Visit | Attending: Family Medicine | Admitting: Family Medicine

## 2016-10-11 DIAGNOSIS — R140 Abdominal distension (gaseous): Secondary | ICD-10-CM

## 2016-10-11 DIAGNOSIS — R109 Unspecified abdominal pain: Secondary | ICD-10-CM | POA: Diagnosis not present

## 2016-10-15 ENCOUNTER — Encounter: Payer: Self-pay | Admitting: Family Medicine

## 2016-10-19 ENCOUNTER — Other Ambulatory Visit: Payer: Self-pay | Admitting: Family Medicine

## 2016-10-19 DIAGNOSIS — N2889 Other specified disorders of kidney and ureter: Secondary | ICD-10-CM

## 2016-10-25 DIAGNOSIS — F900 Attention-deficit hyperactivity disorder, predominantly inattentive type: Secondary | ICD-10-CM | POA: Diagnosis not present

## 2016-10-25 DIAGNOSIS — F411 Generalized anxiety disorder: Secondary | ICD-10-CM | POA: Diagnosis not present

## 2016-11-08 ENCOUNTER — Encounter: Payer: Self-pay | Admitting: Family Medicine

## 2016-11-10 ENCOUNTER — Telehealth: Payer: Self-pay | Admitting: Family Medicine

## 2016-11-10 DIAGNOSIS — N2889 Other specified disorders of kidney and ureter: Secondary | ICD-10-CM

## 2016-11-10 NOTE — Telephone Encounter (Signed)
[facility] w/o contrast but saw note from [facility] that they need order changed to MRI Abdomen W and W/O. I called them to confirm this and they stated for a mass, they will need the order changed. Please advise and I will call [facility] back at [phone] to get new auth. Thanks!

## 2016-11-12 NOTE — Telephone Encounter (Signed)
New MRI order approved by insurance. Called [facility] and they said they will call pt to schedule. Thanks!

## 2016-11-12 NOTE — Telephone Encounter (Signed)
MRI abdomen with and without placed.

## 2016-11-18 DIAGNOSIS — F411 Generalized anxiety disorder: Secondary | ICD-10-CM | POA: Diagnosis not present

## 2016-11-18 DIAGNOSIS — F900 Attention-deficit hyperactivity disorder, predominantly inattentive type: Secondary | ICD-10-CM | POA: Diagnosis not present

## 2016-11-24 ENCOUNTER — Other Ambulatory Visit: Payer: BLUE CROSS/BLUE SHIELD

## 2016-12-02 ENCOUNTER — Ambulatory Visit
Admission: RE | Admit: 2016-12-02 | Discharge: 2016-12-02 | Disposition: A | Discharge status: Home / Self Care | Payer: BLUE CROSS/BLUE SHIELD | Source: Ambulatory Visit | Attending: Family Medicine | Admitting: Family Medicine

## 2016-12-02 DIAGNOSIS — N2889 Other specified disorders of kidney and ureter: Secondary | ICD-10-CM

## 2016-12-02 DIAGNOSIS — K5730 Diverticulosis of large intestine without perforation or abscess without bleeding: Secondary | ICD-10-CM | POA: Diagnosis not present

## 2016-12-02 MED ORDER — GADOBENATE DIMEGLUMINE 529 MG/ML IV SOLN
15.0000 mL | Freq: Once | INTRAVENOUS | Status: AC | PRN
  Administered 2016-12-02: 15 mL via INTRAVENOUS

## 2016-12-21 DIAGNOSIS — Z79899 Other long term (current) drug therapy: Secondary | ICD-10-CM | POA: Diagnosis not present

## 2016-12-21 DIAGNOSIS — R41840 Attention and concentration deficit: Secondary | ICD-10-CM | POA: Diagnosis not present

## 2016-12-21 DIAGNOSIS — F902 Attention-deficit hyperactivity disorder, combined type: Secondary | ICD-10-CM | POA: Diagnosis not present

## 2016-12-21 DIAGNOSIS — F419 Anxiety disorder, unspecified: Secondary | ICD-10-CM | POA: Diagnosis not present

## 2016-12-28 ENCOUNTER — Other Ambulatory Visit: Payer: Self-pay | Admitting: Internal Medicine

## 2017-01-27 ENCOUNTER — Other Ambulatory Visit: Payer: Self-pay | Admitting: Internal Medicine

## 2017-02-10 DIAGNOSIS — F902 Attention-deficit hyperactivity disorder, combined type: Secondary | ICD-10-CM | POA: Diagnosis not present

## 2017-02-10 DIAGNOSIS — Z79899 Other long term (current) drug therapy: Secondary | ICD-10-CM | POA: Diagnosis not present

## 2017-02-11 DIAGNOSIS — M9903 Segmental and somatic dysfunction of lumbar region: Secondary | ICD-10-CM | POA: Diagnosis not present

## 2017-02-11 DIAGNOSIS — M9905 Segmental and somatic dysfunction of pelvic region: Secondary | ICD-10-CM | POA: Diagnosis not present

## 2017-02-11 DIAGNOSIS — Q72812 Congenital shortening of left lower limb: Secondary | ICD-10-CM | POA: Diagnosis not present

## 2017-02-11 DIAGNOSIS — M545 Low back pain: Secondary | ICD-10-CM | POA: Diagnosis not present

## 2017-02-14 DIAGNOSIS — M9903 Segmental and somatic dysfunction of lumbar region: Secondary | ICD-10-CM | POA: Diagnosis not present

## 2017-02-14 DIAGNOSIS — M5136 Other intervertebral disc degeneration, lumbar region: Secondary | ICD-10-CM | POA: Diagnosis not present

## 2017-02-14 DIAGNOSIS — Q72812 Congenital shortening of left lower limb: Secondary | ICD-10-CM | POA: Diagnosis not present

## 2017-02-14 DIAGNOSIS — M9905 Segmental and somatic dysfunction of pelvic region: Secondary | ICD-10-CM | POA: Diagnosis not present

## 2017-02-18 DIAGNOSIS — Q72812 Congenital shortening of left lower limb: Secondary | ICD-10-CM | POA: Diagnosis not present

## 2017-02-18 DIAGNOSIS — M5136 Other intervertebral disc degeneration, lumbar region: Secondary | ICD-10-CM | POA: Diagnosis not present

## 2017-02-18 DIAGNOSIS — M9903 Segmental and somatic dysfunction of lumbar region: Secondary | ICD-10-CM | POA: Diagnosis not present

## 2017-02-18 DIAGNOSIS — M9905 Segmental and somatic dysfunction of pelvic region: Secondary | ICD-10-CM | POA: Diagnosis not present

## 2017-02-21 DIAGNOSIS — Q72812 Congenital shortening of left lower limb: Secondary | ICD-10-CM | POA: Diagnosis not present

## 2017-02-21 DIAGNOSIS — M9903 Segmental and somatic dysfunction of lumbar region: Secondary | ICD-10-CM | POA: Diagnosis not present

## 2017-02-21 DIAGNOSIS — M5136 Other intervertebral disc degeneration, lumbar region: Secondary | ICD-10-CM | POA: Diagnosis not present

## 2017-02-21 DIAGNOSIS — M9905 Segmental and somatic dysfunction of pelvic region: Secondary | ICD-10-CM | POA: Diagnosis not present

## 2017-02-22 DIAGNOSIS — M9903 Segmental and somatic dysfunction of lumbar region: Secondary | ICD-10-CM | POA: Diagnosis not present

## 2017-02-22 DIAGNOSIS — M9905 Segmental and somatic dysfunction of pelvic region: Secondary | ICD-10-CM | POA: Diagnosis not present

## 2017-02-22 DIAGNOSIS — M5136 Other intervertebral disc degeneration, lumbar region: Secondary | ICD-10-CM | POA: Diagnosis not present

## 2017-02-22 DIAGNOSIS — Q72812 Congenital shortening of left lower limb: Secondary | ICD-10-CM | POA: Diagnosis not present

## 2017-02-23 ENCOUNTER — Other Ambulatory Visit: Payer: Self-pay | Admitting: Internal Medicine

## 2017-02-24 DIAGNOSIS — M5136 Other intervertebral disc degeneration, lumbar region: Secondary | ICD-10-CM | POA: Diagnosis not present

## 2017-02-24 DIAGNOSIS — Q72812 Congenital shortening of left lower limb: Secondary | ICD-10-CM | POA: Diagnosis not present

## 2017-02-24 DIAGNOSIS — M9905 Segmental and somatic dysfunction of pelvic region: Secondary | ICD-10-CM | POA: Diagnosis not present

## 2017-02-24 DIAGNOSIS — M9903 Segmental and somatic dysfunction of lumbar region: Secondary | ICD-10-CM | POA: Diagnosis not present

## 2017-03-01 DIAGNOSIS — M9905 Segmental and somatic dysfunction of pelvic region: Secondary | ICD-10-CM | POA: Diagnosis not present

## 2017-03-01 DIAGNOSIS — M5136 Other intervertebral disc degeneration, lumbar region: Secondary | ICD-10-CM | POA: Diagnosis not present

## 2017-03-01 DIAGNOSIS — Q72812 Congenital shortening of left lower limb: Secondary | ICD-10-CM | POA: Diagnosis not present

## 2017-03-01 DIAGNOSIS — M9903 Segmental and somatic dysfunction of lumbar region: Secondary | ICD-10-CM | POA: Diagnosis not present

## 2017-03-03 DIAGNOSIS — Q72812 Congenital shortening of left lower limb: Secondary | ICD-10-CM | POA: Diagnosis not present

## 2017-03-03 DIAGNOSIS — M9903 Segmental and somatic dysfunction of lumbar region: Secondary | ICD-10-CM | POA: Diagnosis not present

## 2017-03-03 DIAGNOSIS — M9905 Segmental and somatic dysfunction of pelvic region: Secondary | ICD-10-CM | POA: Diagnosis not present

## 2017-03-03 DIAGNOSIS — M5136 Other intervertebral disc degeneration, lumbar region: Secondary | ICD-10-CM | POA: Diagnosis not present

## 2017-03-08 DIAGNOSIS — M5136 Other intervertebral disc degeneration, lumbar region: Secondary | ICD-10-CM | POA: Diagnosis not present

## 2017-03-08 DIAGNOSIS — M9903 Segmental and somatic dysfunction of lumbar region: Secondary | ICD-10-CM | POA: Diagnosis not present

## 2017-03-08 DIAGNOSIS — Q72812 Congenital shortening of left lower limb: Secondary | ICD-10-CM | POA: Diagnosis not present

## 2017-03-08 DIAGNOSIS — M9905 Segmental and somatic dysfunction of pelvic region: Secondary | ICD-10-CM | POA: Diagnosis not present

## 2017-03-10 DIAGNOSIS — M9905 Segmental and somatic dysfunction of pelvic region: Secondary | ICD-10-CM | POA: Diagnosis not present

## 2017-03-10 DIAGNOSIS — Q72812 Congenital shortening of left lower limb: Secondary | ICD-10-CM | POA: Diagnosis not present

## 2017-03-10 DIAGNOSIS — M9903 Segmental and somatic dysfunction of lumbar region: Secondary | ICD-10-CM | POA: Diagnosis not present

## 2017-03-10 DIAGNOSIS — M5136 Other intervertebral disc degeneration, lumbar region: Secondary | ICD-10-CM | POA: Diagnosis not present

## 2017-03-15 DIAGNOSIS — M9905 Segmental and somatic dysfunction of pelvic region: Secondary | ICD-10-CM | POA: Diagnosis not present

## 2017-03-15 DIAGNOSIS — Q72812 Congenital shortening of left lower limb: Secondary | ICD-10-CM | POA: Diagnosis not present

## 2017-03-15 DIAGNOSIS — M9903 Segmental and somatic dysfunction of lumbar region: Secondary | ICD-10-CM | POA: Diagnosis not present

## 2017-03-15 DIAGNOSIS — M5136 Other intervertebral disc degeneration, lumbar region: Secondary | ICD-10-CM | POA: Diagnosis not present

## 2017-03-18 DIAGNOSIS — M9905 Segmental and somatic dysfunction of pelvic region: Secondary | ICD-10-CM | POA: Diagnosis not present

## 2017-03-18 DIAGNOSIS — Q72812 Congenital shortening of left lower limb: Secondary | ICD-10-CM | POA: Diagnosis not present

## 2017-03-18 DIAGNOSIS — M5136 Other intervertebral disc degeneration, lumbar region: Secondary | ICD-10-CM | POA: Diagnosis not present

## 2017-03-18 DIAGNOSIS — M9903 Segmental and somatic dysfunction of lumbar region: Secondary | ICD-10-CM | POA: Diagnosis not present

## 2017-03-22 DIAGNOSIS — M9903 Segmental and somatic dysfunction of lumbar region: Secondary | ICD-10-CM | POA: Diagnosis not present

## 2017-03-22 DIAGNOSIS — M9905 Segmental and somatic dysfunction of pelvic region: Secondary | ICD-10-CM | POA: Diagnosis not present

## 2017-03-22 DIAGNOSIS — M5136 Other intervertebral disc degeneration, lumbar region: Secondary | ICD-10-CM | POA: Diagnosis not present

## 2017-03-22 DIAGNOSIS — Q72812 Congenital shortening of left lower limb: Secondary | ICD-10-CM | POA: Diagnosis not present

## 2017-03-28 DIAGNOSIS — Q72812 Congenital shortening of left lower limb: Secondary | ICD-10-CM | POA: Diagnosis not present

## 2017-03-28 DIAGNOSIS — M9903 Segmental and somatic dysfunction of lumbar region: Secondary | ICD-10-CM | POA: Diagnosis not present

## 2017-03-28 DIAGNOSIS — M9905 Segmental and somatic dysfunction of pelvic region: Secondary | ICD-10-CM | POA: Diagnosis not present

## 2017-03-28 DIAGNOSIS — M5136 Other intervertebral disc degeneration, lumbar region: Secondary | ICD-10-CM | POA: Diagnosis not present

## 2017-03-29 ENCOUNTER — Other Ambulatory Visit (INDEPENDENT_AMBULATORY_CARE_PROVIDER_SITE_OTHER): Payer: BLUE CROSS/BLUE SHIELD

## 2017-03-29 DIAGNOSIS — E039 Hypothyroidism, unspecified: Secondary | ICD-10-CM

## 2017-03-29 LAB — TSH: TSH: 4.45 u[IU]/mL (ref 0.35–4.50)

## 2017-03-29 LAB — T4, FREE: FREE T4: 0.9 ng/dL (ref 0.60–1.60)

## 2017-05-05 DIAGNOSIS — F902 Attention-deficit hyperactivity disorder, combined type: Secondary | ICD-10-CM | POA: Diagnosis not present

## 2017-05-05 DIAGNOSIS — Z79899 Other long term (current) drug therapy: Secondary | ICD-10-CM | POA: Diagnosis not present

## 2017-06-09 ENCOUNTER — Encounter: Payer: Self-pay | Admitting: Family Medicine

## 2017-07-02 ENCOUNTER — Other Ambulatory Visit: Payer: Self-pay | Admitting: Internal Medicine

## 2017-08-01 DIAGNOSIS — Z79899 Other long term (current) drug therapy: Secondary | ICD-10-CM | POA: Diagnosis not present

## 2017-08-01 DIAGNOSIS — F902 Attention-deficit hyperactivity disorder, combined type: Secondary | ICD-10-CM | POA: Diagnosis not present

## 2017-08-01 DIAGNOSIS — F419 Anxiety disorder, unspecified: Secondary | ICD-10-CM | POA: Diagnosis not present

## 2017-09-03 ENCOUNTER — Other Ambulatory Visit: Payer: Self-pay | Admitting: Internal Medicine

## 2017-09-28 ENCOUNTER — Encounter: Payer: Self-pay | Admitting: Internal Medicine

## 2017-09-28 ENCOUNTER — Ambulatory Visit: Payer: BLUE CROSS/BLUE SHIELD | Admitting: Internal Medicine

## 2017-09-28 VITALS — BP 140/80 | HR 58 | Ht 65.0 in | Wt 187.8 lb

## 2017-09-28 DIAGNOSIS — Z23 Encounter for immunization: Secondary | ICD-10-CM | POA: Diagnosis not present

## 2017-09-28 DIAGNOSIS — E039 Hypothyroidism, unspecified: Secondary | ICD-10-CM | POA: Diagnosis not present

## 2017-09-28 DIAGNOSIS — E663 Overweight: Secondary | ICD-10-CM | POA: Diagnosis not present

## 2017-09-28 LAB — TSH: TSH: 7.67 u[IU]/mL — ABNORMAL HIGH (ref 0.35–4.50)

## 2017-09-28 LAB — T4, FREE: Free T4: 0.73 ng/dL (ref 0.60–1.60)

## 2017-09-28 MED ORDER — SYNTHROID 150 MCG PO TABS: 150.0000 ug | ORAL_TABLET | Freq: Every day | ORAL | 5 refills | Status: DC

## 2017-09-28 NOTE — Progress Notes (Signed)
Patient ID: Rebecca Meza, female   DOB: 1975/12/10, 42 y.o.   MRN: [id number]   HPI  Rebecca Meza is a 42 y.o.-year-old female, returning for follow-up for hypothyroidism. Last visit 1 year ago.  She sees a psychiatrist now >> anxiety, ADHD  - on Vyvanse, continues Celexa.  Reviewed history: Pt. has been dx with hypothyroidism in 2004; she was started on Levothyroxine (LT4) - but did not feel good (fatigue, "sluggish"); then Armour (but TFTs difficult to control), then Synthroid in 2005.  Pt is on Synthroid 137 mcg daily, taken: - in am - fasting - at least 30 min from b'fast - no Ca, Fe, PPIs - not on Biotin - on probiotic + Ashwaganda occas. - + MVI at night  Reviewed patient's TFTs: They improved after we moved Synthroid in a.m., except at last visit, when TSH was suppressed.  We decreased her Synthroid dose at that time. Lab Results  Component Value Date   TSH 4.45 03/29/2017   TSH 0.06 (L) 09/28/2016   TSH 0.50 03/29/2016   TSH 0.45 09/29/2015   TSH 3.26 05/29/2015   TSH 4.365 09/09/2014   TSH 4.521 (H) 12/31/2013   TSH 0.988 07/23/2013   TSH 5.711 (H) 04/18/2013   TSH 15.737 (H) 01/18/2013   FREET4 0.90 03/29/2017   FREET4 1.64 (H) 09/28/2016   FREET4 1.25 03/29/2016   FREET4 1.26 09/29/2015   FREET4 0.87 05/29/2015   FREET4 1.03 09/09/2014   FREET4 1.12 12/31/2013   FREET4 1.14 07/23/2013   FREET4 1.00 04/18/2013   FREET4 0.63 (L) 01/18/2013    Her TPO antibodies were not elevated: Component     Latest Ref Rng 05/29/2015  Thyroperoxidase Ab SerPl-aCnc     <9 IU/mL 3   Pt denies: - feeling nodules in neck - hoarseness - dysphagia - choking - SOB with lying down  She has + FH of thyroid disorders in: hypothyroidism in mother and possible MGM. No FH of thyroid cancer. No h/o radiation tx to head or neck.  No herbal supplements. No Biotin use. No recent steroids use.   ROS: Constitutional: + weight gain/no weight loss, no fatigue, no subjective  hyperthermia, no subjective hypothermia Eyes: no blurry vision, no xerophthalmia ENT: no sore throat, + see HPI Cardiovascular: no CP/no SOB/no palpitations/no leg swelling Respiratory: no cough/no SOB/no wheezing Gastrointestinal: no N/no V/no D/no C/no acid reflux Musculoskeletal: no muscle aches/no joint aches Skin: no rashes, no hair loss Neurological: no tremors/no numbness/no tingling/no dizziness  I reviewed pt's medications, allergies, PMH, social hx, family hx, and changes were documented in the history of present illness. Otherwise, unchanged from my initial visit note.   Past Medical History:  Diagnosis Date  . Allergy    oral antihistamine; seasonal.  . Anxiety   . Depression   . Thyroid disease 01/19/2003   hypothyroidism   Past Surgical History:  Procedure Laterality Date  . TONSILLECTOMY     Social History   Social History Main Topics  . Smoking status: Never Smoker   . Smokeless tobacco: Never Used  . Alcohol Use: No  . Drug Use: No  . Sexual Activity: Yes    Birth Control/ Protection: Condom   Social History Narrative   Marital status: married; no abuse; moved from [place] in 2014.      Children two children (8,3)      Lives: with husband, two children.      Employment:  Homemaker      Tobacco: none  Alcohol:  Socially      Drugs: none      Exercise:  Running every other day 2 miles alternating with 3 miles.       Seatbelt:  100%      Guns:  None      Sunscreen: SPF 50-70   Current Outpatient Medications on File Prior to Visit  Medication Sig Dispense Refill  . citalopram (CELEXA) 20 MG tablet Take 1 tablet (20 mg total) by mouth daily. 90 tablet 1  . SYNTHROID 137 MCG tablet TAKE ONE TABLET BY MOUTH EVERY MORNING BEFORE BREAKFAST *NEED LAB APPOINTMENT 30 tablet 0   No current facility-administered medications on file prior to visit.    No Known Allergies Family History  Problem Relation Age of Onset  . Cancer Father 72       pancreatic  cancer  . Hypothyroidism Mother    PE: BP 140/80   Pulse (!) 58   Ht 5\' 5"  (1.651 m)   Wt 187 lb 12.8 oz (85.2 kg)   SpO2 98%   BMI 31.25 kg/m  Wt Readings from Last 3 Encounters:  09/28/17 187 lb 12.8 oz (85.2 kg)  09/28/16 162 lb (73.5 kg)  09/10/16 164 lb 12.8 oz (74.8 kg)   Constitutional: overweight, in NAD Eyes: PERRLA, EOMI, no exophthalmos ENT: moist mucous membranes, no thyromegaly, no cervical lymphadenopathy Cardiovascular: RRR, No MRG Respiratory: CTA B Gastrointestinal: abdomen soft, NT, ND, BS+ Musculoskeletal: no deformities, strength intact in all 4 Skin: moist, warm, no rashes Neurological: no tremor with outstretched hands, DTR normal in all 4  ASSESSMENT: 1. Hypothyroidism  2. Overweight  PLAN:  1. Patient with long-standing hypothyroidism, on brand-name Synthroid therapy with initially fluctuating control, but improved after we moved the thyroid hormone from bedtime to a.m.  At last visit, her TSH was suppressed so we decreased the dose of her Synthroid. - latest thyroid labs reviewed with pt >> normal 03/2017 - she continues on LT4 137 mcg daily - pt feels good on this dose, without significant fatigue.  Her weight gain is likely not related to the levothyroxine dose decreased, but the fact that she stopped doing intermittent fasting (she was fasting for a full day twice a week in the past) - we discussed about taking the thyroid hormone every day, with water, >30 minutes before breakfast, separated by >4 hours from acid reflux medications, calcium, iron, multivitamins. Pt. is taking it correctly. - will check thyroid tests today: TSH and fT4 - If labs are abnormal, she will need to return for repeat TFTs in 1.5 months - Otherwise, I will see her back in a year  2. Overweight -At last visit, she was doing intermittent fasting and lost more than 20 pounds. -Unfortunately, since last visit, she gained them all back - stress over the summer  We will give  her the flu shot today.  Needs refills.  Component     Latest Ref Rng & Units 09/28/2017  TSH     0.35 - 4.50 uIU/mL 7.67 (H)  T4,Free(Direct)     0.60 - 1.60 ng/dL 4.54  TSH is higher.  At this point, since she gained weight back, will need to increase her levothyroxine back to 150 mcg daily.  We will check her labs again in 1.5 months.  Carlus Pavlov, MD PhD [facility]

## 2017-09-28 NOTE — Patient Instructions (Signed)
Please stop at the lab.  Please continue Synthroid 137 mcg daily.  Take the thyroid hormone every day, with water, at least 30 minutes before breakfast, separated by at least 4 hours from: - acid reflux medications - calcium - iron - multivitamins  Please return in 1 year.

## 2017-10-06 ENCOUNTER — Other Ambulatory Visit: Payer: Self-pay | Admitting: Internal Medicine

## 2017-10-06 NOTE — Telephone Encounter (Signed)
This was Rebecca Meza/C'ed/New Rx was sent in for 15150mcg/thx dmf

## 2017-11-01 DIAGNOSIS — F902 Attention-deficit hyperactivity disorder, combined type: Secondary | ICD-10-CM | POA: Diagnosis not present

## 2017-11-01 DIAGNOSIS — Z79899 Other long term (current) drug therapy: Secondary | ICD-10-CM | POA: Diagnosis not present

## 2018-01-05 DIAGNOSIS — N6320 Unspecified lump in the left breast, unspecified quadrant: Secondary | ICD-10-CM | POA: Diagnosis not present

## 2018-01-09 ENCOUNTER — Other Ambulatory Visit: Payer: Self-pay | Admitting: Obstetrics and Gynecology

## 2018-01-09 DIAGNOSIS — N6320 Unspecified lump in the left breast, unspecified quadrant: Secondary | ICD-10-CM

## 2018-01-23 ENCOUNTER — Ambulatory Visit
Admission: RE | Admit: 2018-01-23 | Discharge: 2018-01-23 | Disposition: A | Discharge status: Home / Self Care | Payer: BLUE CROSS/BLUE SHIELD | Source: Ambulatory Visit | Attending: Obstetrics and Gynecology | Admitting: Obstetrics and Gynecology

## 2018-01-23 DIAGNOSIS — N6320 Unspecified lump in the left breast, unspecified quadrant: Secondary | ICD-10-CM

## 2018-01-23 DIAGNOSIS — R928 Other abnormal and inconclusive findings on diagnostic imaging of breast: Secondary | ICD-10-CM | POA: Diagnosis not present

## 2018-01-23 DIAGNOSIS — N6323 Unspecified lump in the left breast, lower outer quadrant: Secondary | ICD-10-CM | POA: Diagnosis not present

## 2018-01-31 DIAGNOSIS — Z79899 Other long term (current) drug therapy: Secondary | ICD-10-CM | POA: Diagnosis not present

## 2018-01-31 DIAGNOSIS — F902 Attention-deficit hyperactivity disorder, combined type: Secondary | ICD-10-CM | POA: Diagnosis not present

## 2018-02-10 DIAGNOSIS — Z6831 Body mass index (BMI) 31.0-31.9, adult: Secondary | ICD-10-CM | POA: Diagnosis not present

## 2018-02-10 DIAGNOSIS — Z01419 Encounter for gynecological examination (general) (routine) without abnormal findings: Secondary | ICD-10-CM | POA: Diagnosis not present

## 2018-02-10 DIAGNOSIS — Z130 Encounter for screening for diseases of the blood and blood-forming organs and certain disorders involving the immune mechanism: Secondary | ICD-10-CM | POA: Diagnosis not present

## 2018-02-10 DIAGNOSIS — Z124 Encounter for screening for malignant neoplasm of cervix: Secondary | ICD-10-CM | POA: Diagnosis not present

## 2018-08-03 ENCOUNTER — Other Ambulatory Visit: Payer: Self-pay | Admitting: Internal Medicine

## 2018-09-28 ENCOUNTER — Other Ambulatory Visit: Payer: Self-pay

## 2018-10-02 ENCOUNTER — Ambulatory Visit (INDEPENDENT_AMBULATORY_CARE_PROVIDER_SITE_OTHER): Payer: BC Managed Care – PPO | Admitting: Internal Medicine

## 2018-10-02 DIAGNOSIS — E039 Hypothyroidism, unspecified: Secondary | ICD-10-CM

## 2018-10-02 DIAGNOSIS — E669 Obesity, unspecified: Secondary | ICD-10-CM | POA: Diagnosis not present

## 2018-10-02 NOTE — Progress Notes (Signed)
Date of visit: 10/05/2018  Patient ID: Rebecca Meza, female   DOB: 1975-11-21, 43 y.o.   MRN: [id number]   HPI  Rebecca Meza is a 43 y.o.-year-old female, returning for follow-up for hypothyroidism. Last visit 1 year ago.  She started to see a psychiatrist before last visit (for anxiety, ADHD)-she continues on Vyvanse, Celexa.  Reviewed history: Pt. has been dx with hypothyroidism in 2004; she was started on Levothyroxine (LT4) - but did not feel good (fatigue, "sluggish"); then Armour (but TFTs difficult to control), then Synthroid in 2005.  Pt is on Synthroid 150 mcg daily (increased from 137 mcg at last visit), taken: - in am - fasting - at least 30 min from b'fast - no Ca, Fe, PPIs - + MVIs at night - not on Biotin - + Occasional Ashwaganda  Reviewed latest TFTs: Lab Results  Component Value Date   TSH 7.67 (H) 09/28/2017   TSH 4.45 03/29/2017   TSH 0.06 (L) 09/28/2016   TSH 0.50 03/29/2016   TSH 0.45 09/29/2015   TSH 3.26 05/29/2015   TSH 4.365 09/09/2014   TSH 4.521 (H) 12/31/2013   TSH 0.988 07/23/2013   TSH 5.711 (H) 04/18/2013   FREET4 0.73 09/28/2017   FREET4 0.90 03/29/2017   FREET4 1.64 (H) 09/28/2016   FREET4 1.25 03/29/2016   FREET4 1.26 09/29/2015   FREET4 0.87 05/29/2015   FREET4 1.03 09/09/2014   FREET4 1.12 12/31/2013   FREET4 1.14 07/23/2013   FREET4 1.00 04/18/2013    Her TPO antibodies were not elevated: Component     Latest Ref Rng 05/29/2015  Thyroperoxidase Ab SerPl-aCnc     <9 IU/mL 3   Pt denies: - feeling nodules in neck - hoarseness - dysphagia - choking - SOB with lying down  She has + FH of thyroid disorders in: hypothyroidism in mother and possible MGM. No FH of thyroid cancer. No h/o radiation tx to head or neck.  No herbal supplements. No Biotin use. No recent steroids use.   ROS: Constitutional: + weight gain/no weight loss, no fatigue, no subjective hyperthermia, no subjective hypothermia Eyes: no blurry vision,  no xerophthalmia ENT: no sore throat, + see HPI Cardiovascular: no CP/no SOB/no palpitations/no leg swelling Respiratory: no cough/no SOB/no wheezing Gastrointestinal: no N/no V/no D/no C/no acid reflux Musculoskeletal: no muscle aches/no joint aches Skin: no rashes, no hair loss Neurological: no tremors/no numbness/no tingling/no dizziness  I reviewed pt's medications, allergies, PMH, social hx, family hx, and changes were documented in the history of present illness. Otherwise, unchanged from my initial visit note.   Past Medical History:  Diagnosis Date  . Allergy    oral antihistamine; seasonal.  . Anxiety   . Depression   . Thyroid disease 01/19/2003   hypothyroidism   Past Surgical History:  Procedure Laterality Date  . TONSILLECTOMY     Social History   Social History Main Topics  . Smoking status: Never Smoker   . Smokeless tobacco: Never Used  . Alcohol Use: No  . Drug Use: No  . Sexual Activity: Yes    Birth Control/ Protection: Condom   Social History Narrative   Marital status: married; no abuse; moved from [place] in 2014.      Children two children (8,3)      Lives: with husband, two children.      Employment:  Homemaker      Tobacco: none      Alcohol:  Socially      Drugs: none  Exercise:  Running every other day 2 miles alternating with 3 miles.       Seatbelt:  100%      Guns:  None      Sunscreen: SPF 50-70   Current Outpatient Medications on File Prior to Visit  Medication Sig Dispense Refill  . citalopram (CELEXA) 20 MG tablet Take 1 tablet (20 mg total) by mouth daily. 90 tablet 1  . SYNTHROID 150 MCG tablet TAKE ONE TABLET BY MOUTH EVERY MORNING BEFORE  BREAKFAST 30 tablet 4  . VYVANSE 10 MG capsule      No current facility-administered medications on file prior to visit.    No Known Allergies Family History  Problem Relation Age of Onset  . Cancer Father 29       pancreatic cancer  . Hypothyroidism Mother   . Breast cancer Cousin     PE: BP (!) 142/90   Pulse 60   Ht 5\' 5"  (1.651 m) Comment: measured today without shoes  Wt 193 lb (87.5 kg)   SpO2 98%   BMI 32.12 kg/m  Wt Readings from Last 3 Encounters:  10/05/18 193 lb (87.5 kg)  09/28/17 187 lb 12.8 oz (85.2 kg)  09/28/16 162 lb (73.5 kg)   Constitutional: overweight, in NAD Eyes: PERRLA, EOMI, no exophthalmos ENT: moist mucous membranes, no thyromegaly, no cervical lymphadenopathy Cardiovascular: RRR, No MRG Respiratory: CTA B Gastrointestinal: abdomen soft, NT, ND, BS+ Musculoskeletal: no deformities, strength intact in all 4 Skin: moist, warm, no rashes Neurological: no tremor with outstretched hands, DTR normal in all 4  ASSESSMENT: 1. Hypothyroidism - acquired  2. Obesity class I/wt gain  PLAN:  1. Patient with longstanding hypothyroidism, on brand name Synthroid therapy which fluctuating controlled.  Her hypothyroidism control improved after we move the thyroid hormone from bedtime to a.m.  At last visit, TSH was high so we increased her Synthroid dose. - latest thyroid labs reviewed with pt >> TSH high at last office visit and she did not return in 1.5 months after we increased Synthroid dose... We discussed that she will need to come back for labs after any change in Synthroid dose to make sure the dose is correct. - she continues on Synthroid d.a.w. 150 mcg daily - pt feels good on this dose but is frustrated about her weight. - we discussed about taking the thyroid hormone every day, with water, >30 minutes before breakfast, separated by >4 hours from acid reflux medications, calcium, iron, multivitamins. Pt. is taking it correctly. - will check thyroid tests today: TSH and fT4 - If labs are abnormal, she will need to return for repeat TFTs in 1.5 months - OTW, I will see her in 1 year  2.  Obesity class I/weight gain -patient is very frustrated about her weight gain -We discussed about diet: She tried intermittent fasting and will  levothyroxine 2 days a week.  She lost a significant amount of weight but was not able to maintain this. -She is wondering whether CBD oil can help her.  I advised her that this is not FDA approved so I could not recommend it. -I recommended that she join either the [facility] weight management clinic or the [facility] weight loss clinic.  She will look into the 2 programs and let me know if she wanted me to refer her.  Component     Latest Ref Rng & Units 10/05/2018  TSH     0.35 - 4.50 uIU/mL 2.24  T4,Free(Direct)  0.60 - 1.60 ng/dL 0.981.05  Labs are normal.  Carlus Pavlovristina Zaylynn Rickett, MD PhD [facility]

## 2018-10-05 ENCOUNTER — Other Ambulatory Visit: Payer: Self-pay

## 2018-10-05 ENCOUNTER — Encounter: Payer: Self-pay | Admitting: Internal Medicine

## 2018-10-05 ENCOUNTER — Ambulatory Visit: Payer: BC Managed Care – PPO | Admitting: Internal Medicine

## 2018-10-05 VITALS — BP 142/90 | HR 60 | Ht 65.0 in | Wt 193.0 lb

## 2018-10-05 DIAGNOSIS — E039 Hypothyroidism, unspecified: Secondary | ICD-10-CM | POA: Diagnosis not present

## 2018-10-05 LAB — T4, FREE: Free T4: 1.05 ng/dL (ref 0.60–1.60)

## 2018-10-05 LAB — TSH: TSH: 2.24 u[IU]/mL (ref 0.35–4.50)

## 2018-10-05 NOTE — Patient Instructions (Signed)
Please stop at the lab.  Please continue Synthroid 150 mcg daily.  Take the thyroid hormone every day, with water, at least 30 minutes before breakfast, separated by at least 4 hours from: - acid reflux medications - calcium - iron - multivitamins  Please return in 1 year.   

## 2018-10-06 ENCOUNTER — Encounter: Payer: Self-pay | Admitting: Internal Medicine

## 2018-10-06 NOTE — Progress Notes (Signed)
Duplicate

## 2019-01-18 ENCOUNTER — Other Ambulatory Visit: Payer: Self-pay | Admitting: Internal Medicine

## 2019-05-18 ENCOUNTER — Other Ambulatory Visit: Payer: Self-pay | Admitting: Endocrinology

## 2019-05-25 IMAGING — US US ABDOMEN COMPLETE
1 series · 13 of 25 positions shown · non-contrast
Comparison: None in PACs

CLINICAL DATA: Postprandial gas, diarrhea, and right flank pain for
the past 6 months.

EXAM:
ABDOMEN ULTRASOUND COMPLETE

[Series 1: us abdomen complete · 0.22mm/px · 13 of 87 slices shown]
[im 1/87]
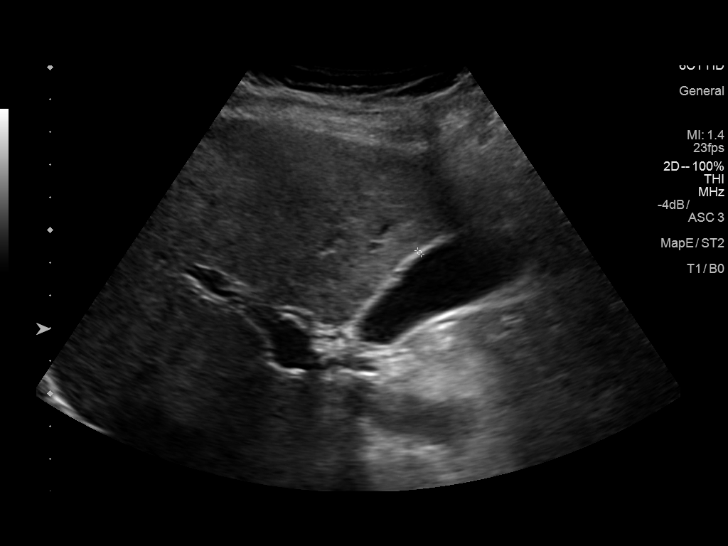
[im 8/87]
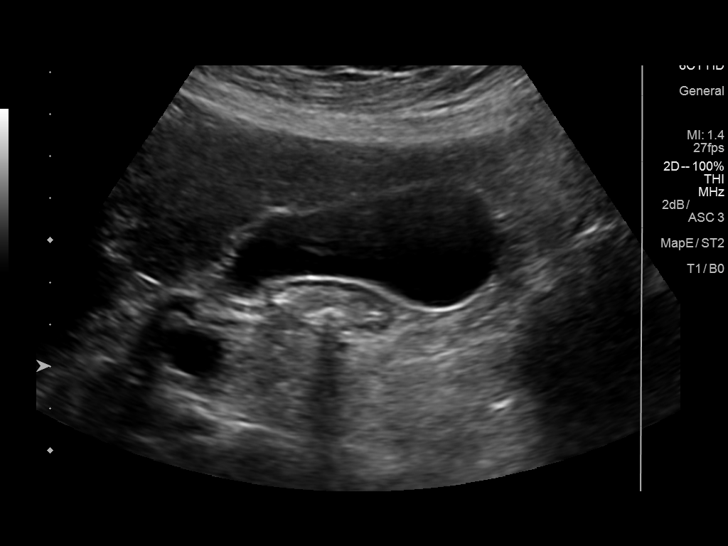
[im 15/87]
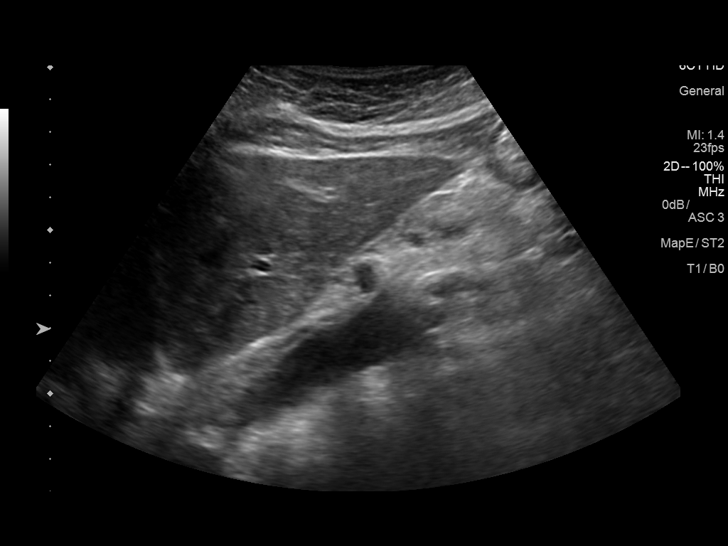
[im 22/87]
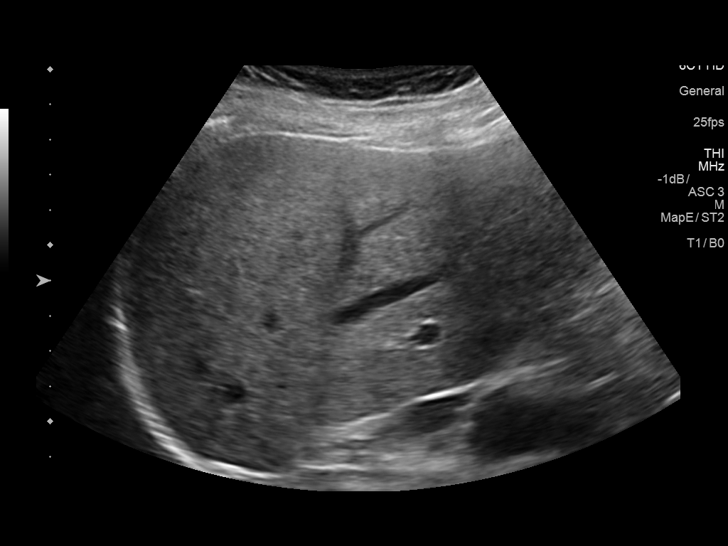
[im 29/87]
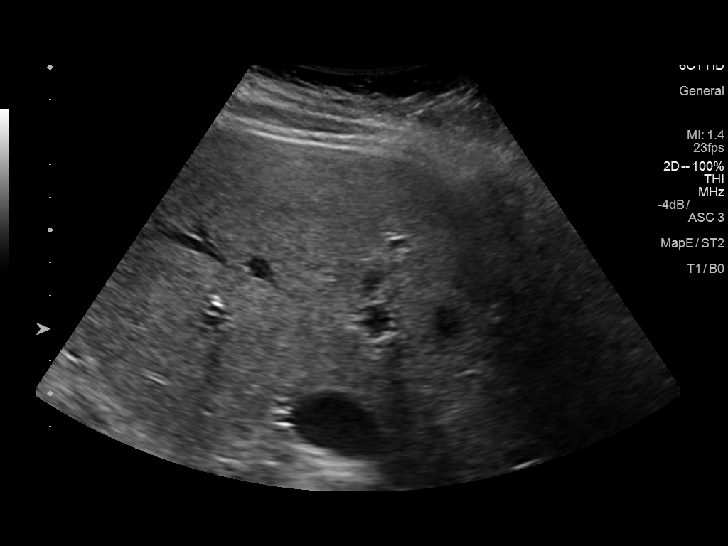
[im 36/87]
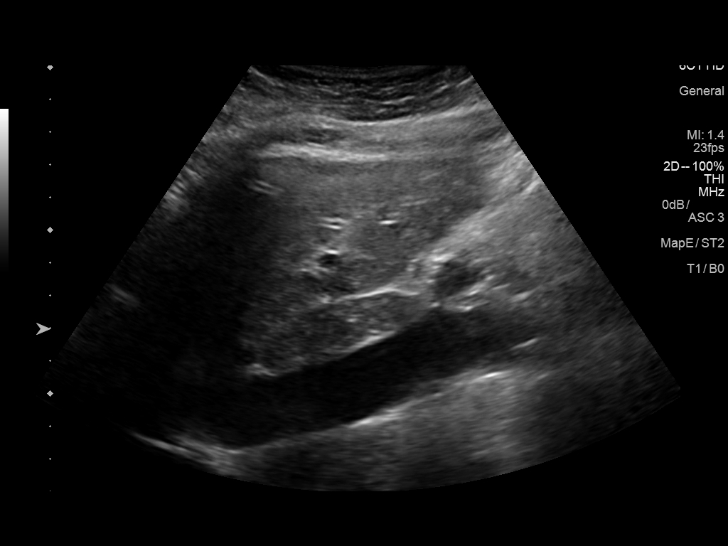
[im 44/87]
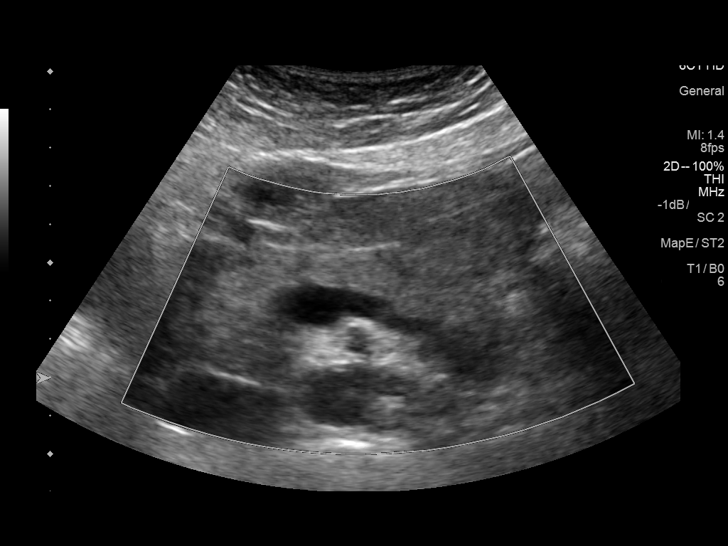
[im 51/87]
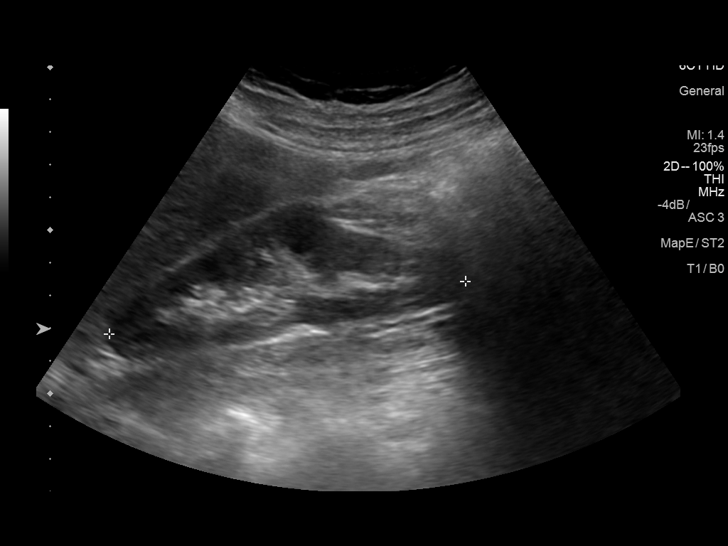
[im 58/87]
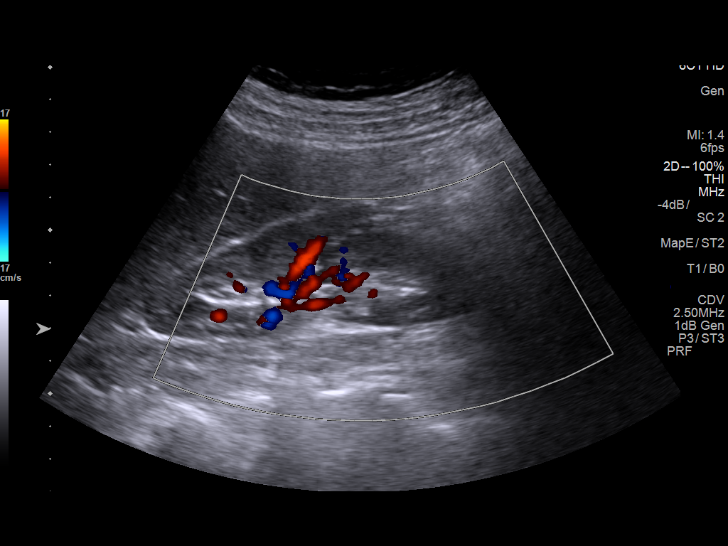
[im 65/87]
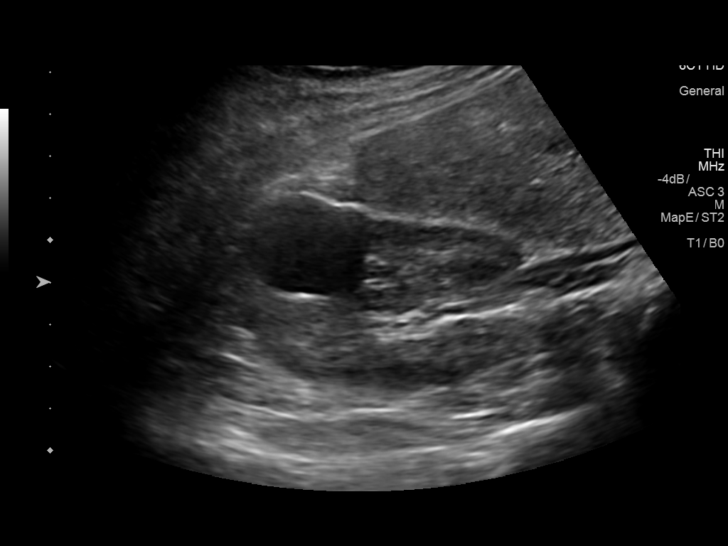
[im 72/87]
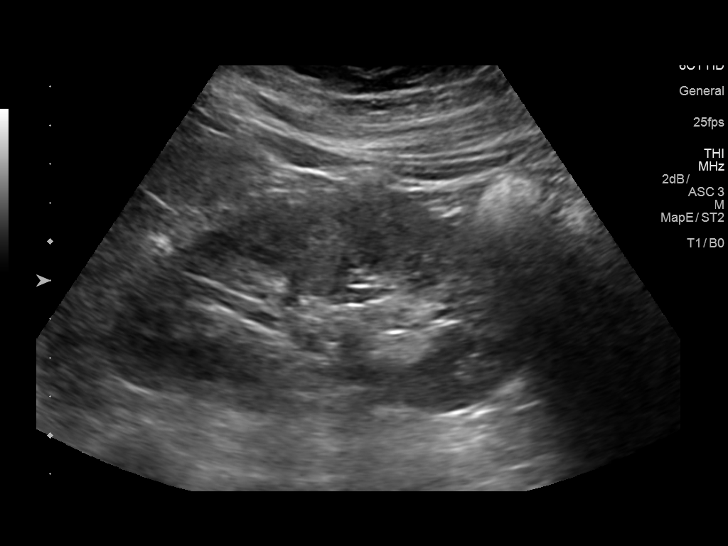
[im 79/87]
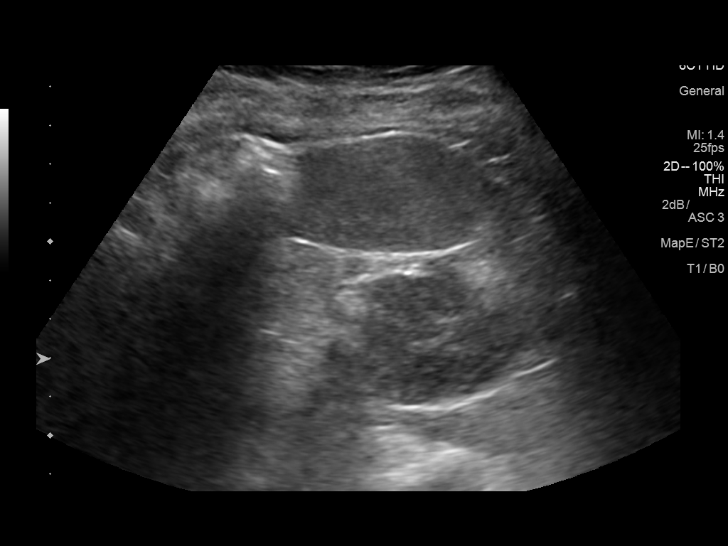
[im 87/87]
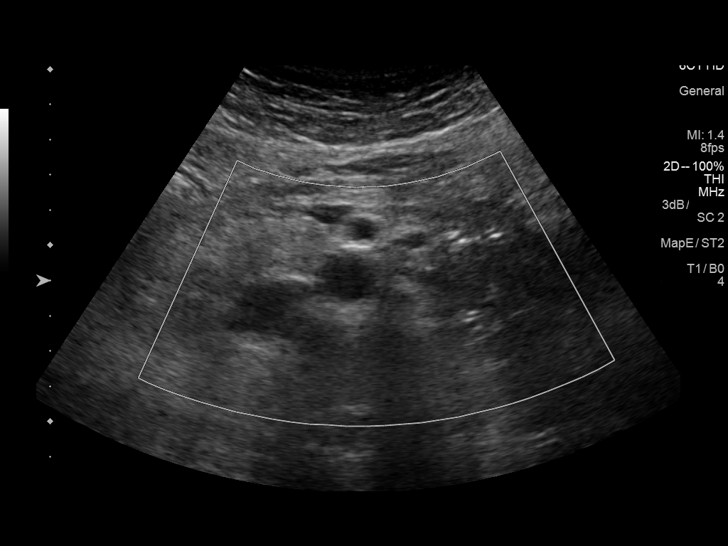

[13 of 25 positions shown; findings below may reference images not displayed]

FINDINGS: Gallbladder: No gallstones or wall thickening visualized. No
sonographic Murphy sign noted by sonographer.

Common bile duct: Diameter: 3.6 mm

Liver: The hepatic echotexture is mildly increased diffusely. There
is no focal mass or ductal dilation. The surface contour of the
liver is smooth. Portal vein is patent on color Doppler imaging with
normal direction of blood flow towards the liver.

IVC: No abnormality visualized.

Pancreas: Visualized portion unremarkable.

Spleen: Size and appearance within normal limits.

Right Kidney: Length: 11.0 cm. There is a hypoechoic mass exophytic
from the midpole cortex which is not hypervascular. It measures
x 2.5 x 2.9 cm. There is no hydronephrosis.

Left Kidney: Length: 11.1 cm. Echogenicity within normal limits. No
mass or hydronephrosis visualized.

Abdominal aorta: No aneurysm visualized.

Other findings: None.
IMPRESSION: Normal appearance of the gallbladder with no evidence of stones or
acute cholecystitis. If there are clinical concerns of chronic
gallbladder dysfunction, a nuclear medicine hepatobiliary scan with
gallbladder ejection fraction determination may be useful.

Increased hepatic echotexture most compatible with fatty
infiltrative change.

Solid-appearing mass in the midpole of the right kidney measuring up
to 2.9 cm in diameter. Renal protocol MRI is recommended.

## 2019-10-05 ENCOUNTER — Ambulatory Visit: Payer: BC Managed Care – PPO | Admitting: Internal Medicine

## 2019-10-12 ENCOUNTER — Other Ambulatory Visit: Payer: Self-pay | Admitting: Obstetrics and Gynecology

## 2019-10-12 DIAGNOSIS — Z1231 Encounter for screening mammogram for malignant neoplasm of breast: Secondary | ICD-10-CM

## 2019-11-21 ENCOUNTER — Other Ambulatory Visit: Payer: Self-pay | Admitting: Obstetrics and Gynecology

## 2019-11-21 DIAGNOSIS — N6320 Unspecified lump in the left breast, unspecified quadrant: Secondary | ICD-10-CM

## 2019-11-28 ENCOUNTER — Encounter: Payer: Self-pay | Admitting: Internal Medicine

## 2019-11-30 ENCOUNTER — Ambulatory Visit: Payer: 59 | Admitting: Internal Medicine

## 2019-11-30 ENCOUNTER — Other Ambulatory Visit: Payer: Self-pay

## 2019-11-30 ENCOUNTER — Encounter: Payer: Self-pay | Admitting: Internal Medicine

## 2019-11-30 VITALS — BP 122/82 | HR 60 | Ht 65.0 in | Wt 194.8 lb

## 2019-11-30 DIAGNOSIS — Z23 Encounter for immunization: Secondary | ICD-10-CM

## 2019-11-30 DIAGNOSIS — E039 Hypothyroidism, unspecified: Secondary | ICD-10-CM

## 2019-11-30 LAB — TSH: TSH: 2.57 u[IU]/mL (ref 0.35–4.50)

## 2019-11-30 LAB — T4, FREE: Free T4: 1.3 ng/dL (ref 0.60–1.60)

## 2019-11-30 NOTE — Patient Instructions (Signed)
Please stop at the lab.  Please continue Synthroid 150 mcg daily.  Take the thyroid hormone every day, with water, at least 30 minutes before breakfast, separated by at least 4 hours from: - acid reflux medications - calcium - iron - multivitamins  Please return in 1 year.   

## 2019-11-30 NOTE — Progress Notes (Signed)
Date of visit: 10/05/2018  Patient ID: Rebecca Meza, female   DOB: 14-Mar-1975, 44 y.o.   MRN: [id number]  This visit occurred during the SARS-CoV-2 public health emergency.  Safety protocols were in place, including screening questions prior to the visit, additional usage of staff PPE, and extensive cleaning of exam room while observing appropriate contact time as indicated for disinfecting solutions.   HPI  Rebecca Meza is a 44 y.o.-year-old female, returning for follow-up for hypothyroidism. Last visit 1 year and 2 months ago.  She started to go to the gym ([employer]) 3x a week - rowing machine.  Reviewed history: Pt. has been dx with hypothyroidism in 2004; she was started on Levothyroxine (LT4) - but did not feel good (fatigue, "sluggish"); then Armour (but TFTs difficult to control), then Synthroid d.a.w. in 2005.  She is on Synthroid 150 mcg daily: - in am - fasting - at least 30 min from b'fast - no calcium - no iron - + multivitamins later in the day - no PPIs - not on Biotin Now off ashwagandha.  Reviewed her TFTs: Lab Results  Component Value Date   TSH 2.24 10/05/2018   TSH 7.67 (H) 09/28/2017   TSH 4.45 03/29/2017   TSH 0.06 (L) 09/28/2016   TSH 0.50 03/29/2016   TSH 0.45 09/29/2015   TSH 3.26 05/29/2015   TSH 4.365 09/09/2014   TSH 4.521 (H) 12/31/2013   TSH 0.988 07/23/2013   FREET4 1.05 10/05/2018   FREET4 0.73 09/28/2017   FREET4 0.90 03/29/2017   FREET4 1.64 (H) 09/28/2016   FREET4 1.25 03/29/2016   FREET4 1.26 09/29/2015   FREET4 0.87 05/29/2015   FREET4 1.03 09/09/2014   FREET4 1.12 12/31/2013   FREET4 1.14 07/23/2013    Her TPO antibodies were not elevated: Component     Latest Ref Rng 05/29/2015  Thyroperoxidase Ab SerPl-aCnc     <9 IU/mL 3   Pt denies: - feeling nodules in neck - hoarseness - dysphagia - choking - SOB with lying down  She has + FH of thyroid disorders in: hypothyroidism in mother and possible MGM. No FH of thyroid  cancer. No h/o radiation tx to head or neck.  No seaweed or kelp. No recent contrast studies. No herbal supplements. No Biotin use. No recent steroids use.   She is seeing psychiatry (for anxiety, ADHD)-she continues on Vyvanse, Celexa.  She will have an endometrial ablation in 01/2020.  ROS: Constitutional: no weight gain/no weight loss, no fatigue, no subjective hyperthermia, no subjective hypothermia Eyes: no blurry vision, no xerophthalmia ENT: no sore throat, + see HPI Cardiovascular: no CP/no SOB/no palpitations/no leg swelling Respiratory: no cough/no SOB/no wheezing Gastrointestinal: no N/no V/no D/no C/no acid reflux Musculoskeletal: no muscle aches/no joint aches Skin: no rashes, no hair loss Neurological: no tremors/no numbness/no tingling/no dizziness  I reviewed pt's medications, allergies, PMH, social hx, family hx, and changes were documented in the history of present illness. Otherwise, unchanged from my initial visit note.   Past Medical History:  Diagnosis Date  . Allergy    oral antihistamine; seasonal.  . Anxiety   . Depression   . Thyroid disease 01/19/2003   hypothyroidism   Past Surgical History:  Procedure Laterality Date  . TONSILLECTOMY     Social History   Social History Main Topics  . Smoking status: Never Smoker   . Smokeless tobacco: Never Used  . Alcohol Use: No  . Drug Use: No  . Sexual Activity: Yes  Birth Control/ Protection: Condom   Social History Narrative   Marital status: married; no abuse; moved from [place] in 2014.      Children two children (8,3)      Lives: with husband, two children.      Employment:  Homemaker      Tobacco: none      Alcohol:  Socially      Drugs: none      Exercise:  Running every other day 2 miles alternating with 3 miles.       Seatbelt:  100%      Guns:  None      Sunscreen: SPF 50-70   Current Outpatient Medications on File Prior to Visit  Medication Sig Dispense Refill  . citalopram (CELEXA)  20 MG tablet Take 1 tablet (20 mg total) by mouth daily. (Patient not taking: Reported on 10/05/2018) 90 tablet 1  . SYNTHROID 150 MCG tablet TAKE ONE TABLET BY MOUTH EVERY MORNING BEFORE BREAKFAST 30 tablet 2  . VYVANSE 10 MG capsule      No current facility-administered medications on file prior to visit.   No Known Allergies Family History  Problem Relation Age of Onset  . Cancer Father 63       pancreatic cancer  . Hypothyroidism Mother   . Breast cancer Cousin    PE: BP 122/82   Pulse 60   Ht 5\' 5"  (1.651 m)   Wt 194 lb 12.8 oz (88.4 kg)   SpO2 99%   BMI 32.42 kg/m  Wt Readings from Last 3 Encounters:  11/30/19 194 lb 12.8 oz (88.4 kg)  10/05/18 193 lb (87.5 kg)  09/28/17 187 lb 12.8 oz (85.2 kg)   Constitutional: overweight, in NAD Eyes: PERRLA, EOMI, no exophthalmos ENT: moist mucous membranes, no thyromegaly, no cervical lymphadenopathy Cardiovascular: RRR, No MRG Respiratory: CTA B Gastrointestinal: abdomen soft, NT, ND, BS+ Musculoskeletal: no deformities, strength intact in all 4 Skin: moist, warm, no rashes Neurological: no tremor with outstretched hands, DTR normal in all 4  ASSESSMENT: 1. Hypothyroidism - acquired  PLAN:  1. Patient with longstanding hypothyroidism, on brand-name Synthroid therapy, with still fluctuating TFTs.  Her hypothyroidism control did improve after we moved the thyroid hormone from bedtime to the morning. - latest thyroid labs reviewed with pt >> normal: Lab Results  Component Value Date   TSH 2.24 10/05/2018   - she continues on Synthroid 150 mcg daily - pt feels good on this dose.  She has no complaints at today's visit. - we discussed about taking the thyroid hormone every day, with water, >30 minutes before breakfast, separated by >4 hours from acid reflux medications, calcium, iron, multivitamins. Pt. is taking it correctly. - will check thyroid tests today: TSH and fT4 - If labs are abnormal, she will need to return for  repeat TFTs in 1.5 months - OTW, I will see her back in a year  Needs refills.  Component     Latest Ref Rng & Units 11/30/2019  T4,Free(Direct)     0.60 - 1.60 ng/dL 13/12/2019  TSH     7.89 - 3.81 uIU/mL 2.57  Labs are normal.  0.17, MD PhD [facility]

## 2019-12-04 MED ORDER — SYNTHROID 150 MCG PO TABS: 150.0000 ug | ORAL_TABLET | Freq: Every day | ORAL | 3 refills | Status: DC

## 2019-12-19 ENCOUNTER — Ambulatory Visit
Admission: RE | Admit: 2019-12-19 | Discharge: 2019-12-19 | Disposition: A | Discharge status: Home / Self Care | Payer: 59 | Source: Ambulatory Visit | Attending: Obstetrics and Gynecology | Admitting: Obstetrics and Gynecology

## 2019-12-19 ENCOUNTER — Other Ambulatory Visit: Payer: Self-pay

## 2019-12-19 DIAGNOSIS — N6320 Unspecified lump in the left breast, unspecified quadrant: Secondary | ICD-10-CM

## 2020-01-28 ENCOUNTER — Other Ambulatory Visit: Payer: 59

## 2020-01-28 DIAGNOSIS — Z20822 Contact with and (suspected) exposure to covid-19: Secondary | ICD-10-CM

## 2020-01-30 LAB — NOVEL CORONAVIRUS, NAA: SARS-CoV-2, NAA: NOT DETECTED

## 2020-01-30 LAB — SARS-COV-2, NAA 2 DAY TAT

## 2020-11-28 ENCOUNTER — Encounter: Payer: Self-pay | Admitting: Internal Medicine

## 2020-11-28 ENCOUNTER — Ambulatory Visit: Payer: 59 | Admitting: Internal Medicine

## 2020-11-28 ENCOUNTER — Other Ambulatory Visit: Payer: Self-pay

## 2020-11-28 VITALS — BP 122/79 | HR 59 | Ht 65.0 in | Wt 199.8 lb

## 2020-11-28 DIAGNOSIS — E039 Hypothyroidism, unspecified: Secondary | ICD-10-CM

## 2020-11-28 DIAGNOSIS — Z23 Encounter for immunization: Secondary | ICD-10-CM | POA: Diagnosis not present

## 2020-11-28 LAB — T4, FREE: Free T4: 1.2 ng/dL (ref 0.60–1.60)

## 2020-11-28 LAB — TSH: TSH: 2.75 u[IU]/mL (ref 0.35–5.50)

## 2020-11-28 MED ORDER — SYNTHROID 150 MCG PO TABS: 150.0000 ug | ORAL_TABLET | Freq: Every day | ORAL | 3 refills | Status: DC

## 2020-11-28 NOTE — Patient Instructions (Signed)
Please stop at the lab.  Please continue Synthroid 150 mcg daily.  Take the thyroid hormone every day, with water, at least 30 minutes before breakfast, separated by at least 4 hours from: - acid reflux medications - calcium - iron - multivitamins  Please return in 1 year.   

## 2020-11-28 NOTE — Progress Notes (Signed)
Date of visit: 10/05/2018  Patient ID: Rebecca Meza, female   DOB: Jul 29, 1975, 45 y.o.   MRN: [id number]  This visit occurred during the SARS-CoV-2 public health emergency.  Safety protocols were in place, including screening questions prior to the visit, additional usage of staff PPE, and extensive cleaning of exam room while observing appropriate contact time as indicated for disinfecting solutions.   HPI  Rebecca Meza is a 45 y.o.-year-old female, returning for follow-up for hypothyroidism. Last visit 1 year ago.  Interim history: Before last visit, she started to go to the gym ([employer]) 3x a week - rowing machine.  She continues with this >> since then, started [employer]: weights and cardio with a trainer - 3 days a week, also swimming in the rest of the days. Had endometrial ablation 01/2020. Had Covid 19 in 06/2020.  She had wheezing when exercising afterwards and had to use inhaler.  Reviewed history: Pt. has been dx with hypothyroidism in 2004; she was started on Levothyroxine (LT4) - but did not feel good (fatigue, "sluggish"); then Armour (but TFTs difficult to control), then Synthroid d.a.w. in 2005.  She is on Synthroid 150 mcg daily: - in am - fasting - at least 30 min from b'fast - no calcium - no iron - + multivitamins later in the day - no PPIs - not on Biotin Now off ashwagandha.  Reviewed her TFTs: Lab Results  Component Value Date   TSH 2.57 11/30/2019   TSH 2.24 10/05/2018   TSH 7.67 (H) 09/28/2017   TSH 4.45 03/29/2017   TSH 0.06 (L) 09/28/2016   TSH 0.50 03/29/2016   TSH 0.45 09/29/2015   TSH 3.26 05/29/2015   TSH 4.365 09/09/2014   TSH 4.521 (H) 12/31/2013   FREET4 1.30 11/30/2019   FREET4 1.05 10/05/2018   FREET4 0.73 09/28/2017   FREET4 0.90 03/29/2017   FREET4 1.64 (H) 09/28/2016   FREET4 1.25 03/29/2016   FREET4 1.26 09/29/2015   FREET4 0.87 05/29/2015   FREET4 1.03 09/09/2014   FREET4 1.12 12/31/2013    Her TPO antibodies were not  elevated: Component     Latest Ref Rng 05/29/2015  Thyroperoxidase Ab SerPl-aCnc     <9 IU/mL 3   Pt denies: - feeling nodules in neck - hoarseness - dysphagia - choking - SOB with lying down  She has + FH of thyroid disorders in: hypothyroidism in mother and possible MGM. No FH of thyroid cancer. No h/o radiation tx to head or neck. No herbal supplements. No Biotin use. No recent steroids use.   She is seeing psychiatry (for anxiety, ADHD)-she continues on Vyvanse, Celexa. She had an endometrial ablation in 01/2020.  ROS: + see HPI  I reviewed pt's medications, allergies, PMH, social hx, family hx, and changes were documented in the history of present illness. Otherwise, unchanged from my initial visit note.  Past Medical History:  Diagnosis Date   Allergy    oral antihistamine; seasonal.   Anxiety    Depression    Thyroid disease 01/19/2003   hypothyroidism   Past Surgical History:  Procedure Laterality Date   TONSILLECTOMY     Social History   Social History Main Topics   Smoking status: Never Smoker    Smokeless tobacco: Never Used   Alcohol Use: No   Drug Use: No   Sexual Activity: Yes    Birth Control/ Protection: Condom   Social History Narrative   Marital status: married; no abuse; moved from [place]  in 2014.      Children two children (8,3)      Lives: with husband, two children.      Employment:  Homemaker      Tobacco: none      Alcohol:  Socially      Drugs: none      Exercise:  Running every other day 2 miles alternating with 3 miles.       Seatbelt:  100%      Guns:  None      Sunscreen: SPF 50-70   Current Outpatient Medications on File Prior to Visit  Medication Sig Dispense Refill   citalopram (CELEXA) 20 MG tablet Take 1 tablet (20 mg total) by mouth daily. 90 tablet 1   SYNTHROID 150 MCG tablet Take 1 tablet (150 mcg total) by mouth daily with breakfast. 90 tablet 3   VYVANSE 10 MG capsule      No current facility-administered  medications on file prior to visit.   No Known Allergies Family History  Problem Relation Age of Onset   Cancer Father 19       pancreatic cancer   Hypothyroidism Mother    Breast cancer Cousin    PE: BP 122/79 (BP Location: Right Arm, Patient Position: Sitting, Cuff Size: Normal)   Pulse (!) 59   Ht 5\' 5"  (1.651 m)   Wt 199 lb 12.8 oz (90.6 kg)   SpO2 97%   BMI 33.25 kg/m  Wt Readings from Last 3 Encounters:  11/28/20 199 lb 12.8 oz (90.6 kg)  11/30/19 194 lb 12.8 oz (88.4 kg)  10/05/18 193 lb (87.5 kg)   Constitutional: overweight, in NAD Eyes: PERRLA, EOMI, no exophthalmos ENT: moist mucous membranes, no thyromegaly, no cervical lymphadenopathy Cardiovascular: RRR, No MRG Respiratory: CTA B Gastrointestinal: abdomen soft, NT, ND, BS+ Musculoskeletal: no deformities, strength intact in all 4 Skin: moist, warm, no rashes Neurological: no tremor with outstretched hands, DTR normal in all 4  ASSESSMENT: 1. Hypothyroidism - acquired  PLAN:  1. Patient with longstanding hypothyroidism, on brand-name Synthroid therapy.  Her hypothyroidism control improved after we moved the thyroid hormone from bedtime to morning. - latest thyroid labs reviewed with pt. >> normal: Lab Results  Component Value Date   TSH 2.57 11/30/2019  - she continues on Synthroid d.a.w. 150 mcg daily - pt feels good on this dose, without complaints.  She has good exercise tolerance but did have shortness of breath with exercise after her COVID-19 episode.  This has improved and feels that she may not need inhaler anymore. - we discussed about taking the thyroid hormone every day, with water, >30 minutes before breakfast, separated by >4 hours from acid reflux medications, calcium, iron, multivitamins. Pt. is taking it correctly. - will check thyroid tests today: TSH and fT4 - If labs are abnormal, she will need to return for repeat TFTs in 1.5 months - OTW, I will see her back in a year  Needs  refills.  + flu shot today.  Component     Latest Ref Rng & Units 11/28/2020  T4,Free(Direct)     0.60 - 1.60 ng/dL 13/11/2020  TSH     9.67 - 8.93 uIU/mL 2.75  TFTs are normal.  8.10, MD PhD [facility] Endocrinology

## 2021-01-26 ENCOUNTER — Other Ambulatory Visit: Payer: Self-pay | Admitting: Obstetrics and Gynecology

## 2021-01-26 DIAGNOSIS — Z1231 Encounter for screening mammogram for malignant neoplasm of breast: Secondary | ICD-10-CM

## 2021-01-29 ENCOUNTER — Ambulatory Visit
Admission: RE | Admit: 2021-01-29 | Discharge: 2021-01-29 | Disposition: A | Discharge status: Home / Self Care | Payer: 59 | Source: Ambulatory Visit | Attending: Obstetrics and Gynecology | Admitting: Obstetrics and Gynecology

## 2021-01-29 DIAGNOSIS — Z1231 Encounter for screening mammogram for malignant neoplasm of breast: Secondary | ICD-10-CM

## 2021-01-30 ENCOUNTER — Other Ambulatory Visit: Payer: Self-pay | Admitting: Obstetrics and Gynecology

## 2021-01-30 DIAGNOSIS — R928 Other abnormal and inconclusive findings on diagnostic imaging of breast: Secondary | ICD-10-CM

## 2021-02-17 ENCOUNTER — Ambulatory Visit
Admission: RE | Admit: 2021-02-17 | Discharge: 2021-02-17 | Disposition: A | Discharge status: Home / Self Care | Payer: 59 | Source: Ambulatory Visit | Attending: Obstetrics and Gynecology | Admitting: Obstetrics and Gynecology

## 2021-02-17 DIAGNOSIS — R928 Other abnormal and inconclusive findings on diagnostic imaging of breast: Secondary | ICD-10-CM

## 2021-02-17 LAB — HM MAMMOGRAPHY

## 2021-11-30 ENCOUNTER — Ambulatory Visit: Payer: 59 | Admitting: Internal Medicine

## 2021-11-30 ENCOUNTER — Encounter: Payer: Self-pay | Admitting: Internal Medicine

## 2021-11-30 VITALS — BP 122/80 | HR 55 | Ht 64.17 in | Wt 197.8 lb

## 2021-11-30 DIAGNOSIS — M542 Cervicalgia: Secondary | ICD-10-CM | POA: Diagnosis not present

## 2021-11-30 DIAGNOSIS — E039 Hypothyroidism, unspecified: Secondary | ICD-10-CM

## 2021-11-30 LAB — TSH: TSH: 1.86 u[IU]/mL (ref 0.35–5.50)

## 2021-11-30 LAB — T4, FREE: Free T4: 1.14 ng/dL (ref 0.60–1.60)

## 2021-11-30 MED ORDER — SYNTHROID 150 MCG PO TABS: 150.0000 ug | ORAL_TABLET | Freq: Every day | ORAL | 3 refills | Status: DC

## 2021-11-30 NOTE — Patient Instructions (Signed)
Please stop at the lab.  Please continue Synthroid 150 mcg daily.  Take the thyroid hormone every day, with water, at least 30 minutes before breakfast, separated by at least 4 hours from: - acid reflux medications - calcium - iron - multivitamins  Please return in 1 year.   

## 2021-11-30 NOTE — Progress Notes (Signed)
Patient ID: Rebecca Meza, female   DOB: 1975/07/27, 46 y.o.   MRN: [id number]  HPI  Rebecca Meza is a 46 y.o.-year-old female, returning for follow-up for hypothyroidism. Last visit 1 year ago.  Interim history: She stopped gluten sensitivity in 08/2021 >> feeling better now. She describes that after stopping gluten, she felt more pressure in her lower anterior neck.  Reviewed history: Pt. has been dx with hypothyroidism in 2004; she was started on Levothyroxine (LT4) - but did not feel good (fatigued, "sluggish"); then Armour (but TFTs difficult to control), then Synthroid d.a.w. in 2005.  She is on Synthroid 150 mcg daily: - in am - fasting - at least 30 min from b'fast - no calcium - no iron - + multivitamins later in the day - no PPIs - + on Biotin in B complex (75 mcg) - last dose last night Off ashwagandha.  Reviewed her TFTs: Lab Results  Component Value Date   TSH 2.75 11/28/2020   TSH 2.57 11/30/2019   TSH 2.24 10/05/2018   TSH 7.67 (H) 09/28/2017   TSH 4.45 03/29/2017   TSH 0.06 (L) 09/28/2016   TSH 0.50 03/29/2016   TSH 0.45 09/29/2015   TSH 3.26 05/29/2015   TSH 4.365 09/09/2014   FREET4 1.20 11/28/2020   FREET4 1.30 11/30/2019   FREET4 1.05 10/05/2018   FREET4 0.73 09/28/2017   FREET4 0.90 03/29/2017   FREET4 1.64 (H) 09/28/2016   FREET4 1.25 03/29/2016   FREET4 1.26 09/29/2015   FREET4 0.87 05/29/2015   FREET4 1.03 09/09/2014    Her TPO antibodies were not elevated: Component     Latest Ref Rng 05/29/2015  Thyroperoxidase Ab SerPl-aCnc     <9 IU/mL 3   Pt denies: - feeling nodules in neck - hoarseness - dysphagia - choking  She has + FH of thyroid disorders in: hypothyroidism in mother and possible MGM. No FH of thyroid cancer. No h/o radiation tx to head or neck. No herbal supplements. No Biotin use. No recent steroids use.   She is seeing psychiatry (for anxiety, ADHD)-she continues on Vyvanse, Celexa. She had an endometrial ablation  in 01/2020. She goes to the gym ([employer]) 4-6x a week - rowing machine + [employer]: weights and cardio with a trainer - 3 days a week + swimming in the rest of the days.  ROS: + see HPI  I reviewed pt's medications, allergies, PMH, social hx, family hx, and changes were documented in the history of present illness. Otherwise, unchanged from my initial visit note.  Past Medical History:  Diagnosis Date   Allergy    oral antihistamine; seasonal.   Anxiety    Depression    Thyroid disease 01/19/2003   hypothyroidism   Past Surgical History:  Procedure Laterality Date   TONSILLECTOMY     Social History   Social History Main Topics   Smoking status: Never Smoker    Smokeless tobacco: Never Used   Alcohol Use: No   Drug Use: No   Sexual Activity: Yes    Birth Control/ Protection: Condom   Social History Narrative   Marital status: married; no abuse; moved from [place] in 2014.      Children two children (8,3)      Lives: with husband, two children.      Employment:  Homemaker      Tobacco: none      Alcohol:  Socially      Drugs: none  Exercise:  Running every other day 2 miles alternating with 3 miles.       Seatbelt:  100%      Guns:  None      Sunscreen: SPF 50-70   Current Outpatient Medications on File Prior to Visit  Medication Sig Dispense Refill   SYNTHROID 150 MCG tablet Take 1 tablet (150 mcg total) by mouth daily with breakfast. 90 tablet 3   VYVANSE 10 MG capsule      No current facility-administered medications on file prior to visit.   No Known Allergies Family History  Problem Relation Age of Onset   Cancer Father 39       pancreatic cancer   Hypothyroidism Mother    Breast cancer Cousin    PE: BP 122/80 (BP Location: Right Arm, Patient Position: Sitting, Cuff Size: Normal)   Pulse (!) 55   Ht 5' 4.17" (1.63 m)   Wt 197 lb 12.8 oz (89.7 kg)   SpO2 97%   BMI 33.77 kg/m  Wt Readings from Last 3 Encounters:  11/30/21 197 lb 12.8 oz (89.7 kg)   11/28/20 199 lb 12.8 oz (90.6 kg)  11/30/19 194 lb 12.8 oz (88.4 kg)   Constitutional: overweight, in NAD Eyes:  EOMI, no exophthalmos ENT: no neck masses, no cervical lymphadenopathy Cardiovascular: RRR, No MRG Respiratory: CTA B Musculoskeletal: no deformities Skin:no rashes Neurological: no tremor with outstretched hands  ASSESSMENT: 1. Hypothyroidism - acquired  2.  Neck pressure/discomfort  PLAN:  1. Patient with longstanding hypothyroidism, on brand-name Synthroid therapy. - her thyroid is improved after moving levothyroxine from bedtime to morning - latest thyroid labs reviewed with pt. >> normal: Lab Results  Component Value Date   TSH 2.75 11/28/2020  - she continues on Synthroid 150 mcg daily - pt feels good on this dose.   - we discussed about taking the thyroid hormone every day, with water, >30 minutes before breakfast, separated by >4 hours from acid reflux medications, calcium, iron, multivitamins. Pt. is taking it correctly. - will check thyroid tests today: TSH and fT4 - If labs are abnormal, she will need to return for repeat TFTs in 1.5 months - OTW, I will see her back in a year  2.  Neck pressure/discomfort -She describes that she developed neck pressure after stopping gluten.  She still has some pressure now. -We do not have a documented history of Hashimoto's hypothyroidism for her, but this may be the case.  However, I would have expected that her neck symptoms to improve, not to exacerbate after stopping gluten, since we discussed that antithyroid antibodies are actually improved while gluten-free diet.  A casein free diet should also help. -I do not feel thyroid enlargement or other masses on palpation today -We will continue to keep an eye on this, but no intervention is needed for now.  Needs refills.  Component     Latest Ref Rng 11/30/2021  TSH     0.35 - 5.50 uIU/mL 1.86   T4,Free(Direct)     0.60 - 1.60 ng/dL 1.14    We will refill the  same Synthroid dose.  Philemon Kingdom, MD PhD [facility] Endocrinology

## 2021-12-03 ENCOUNTER — Ambulatory Visit (INDEPENDENT_AMBULATORY_CARE_PROVIDER_SITE_OTHER): Payer: 59

## 2021-12-03 ENCOUNTER — Encounter: Payer: Self-pay | Admitting: Nurse Practitioner

## 2021-12-03 ENCOUNTER — Ambulatory Visit: Payer: 59 | Admitting: Nurse Practitioner

## 2021-12-03 VITALS — BP 130/82 | HR 55 | Temp 98.4°F | Ht 64.0 in | Wt 198.8 lb

## 2021-12-03 DIAGNOSIS — E039 Hypothyroidism, unspecified: Secondary | ICD-10-CM | POA: Diagnosis not present

## 2021-12-03 DIAGNOSIS — R053 Chronic cough: Secondary | ICD-10-CM

## 2021-12-03 DIAGNOSIS — Z23 Encounter for immunization: Secondary | ICD-10-CM

## 2021-12-03 DIAGNOSIS — Z131 Encounter for screening for diabetes mellitus: Secondary | ICD-10-CM | POA: Diagnosis not present

## 2021-12-03 DIAGNOSIS — Z136 Encounter for screening for cardiovascular disorders: Secondary | ICD-10-CM | POA: Diagnosis not present

## 2021-12-03 DIAGNOSIS — N6489 Other specified disorders of breast: Secondary | ICD-10-CM

## 2021-12-03 DIAGNOSIS — L989 Disorder of the skin and subcutaneous tissue, unspecified: Secondary | ICD-10-CM

## 2021-12-03 DIAGNOSIS — Z1211 Encounter for screening for malignant neoplasm of colon: Secondary | ICD-10-CM

## 2021-12-03 DIAGNOSIS — L298 Other pruritus: Secondary | ICD-10-CM

## 2021-12-03 DIAGNOSIS — L2989 Other pruritus: Secondary | ICD-10-CM

## 2021-12-03 LAB — CBC
HCT: 44.7 % (ref 36.0–46.0)
Hemoglobin: 15.1 g/dL — ABNORMAL HIGH (ref 12.0–15.0)
MCHC: 33.9 g/dL (ref 30.0–36.0)
MCV: 86.9 fl (ref 78.0–100.0)
Platelets: 309 10*3/uL (ref 150.0–400.0)
RBC: 5.14 Mil/uL — ABNORMAL HIGH (ref 3.87–5.11)
RDW: 14.2 % (ref 11.5–15.5)
WBC: 8.3 10*3/uL (ref 4.0–10.5)

## 2021-12-03 LAB — LIPID PANEL
Cholesterol: 221 mg/dL — ABNORMAL HIGH (ref 0–200)
HDL: 42 mg/dL (ref >39.00)
LDL Cholesterol: 160 mg/dL — ABNORMAL HIGH (ref 0–99)
NonHDL: 179.23
Total CHOL/HDL Ratio: 5
Triglycerides: 96 mg/dL (ref 0.0–149.0)
VLDL: 19.2 mg/dL (ref 0.0–40.0)

## 2021-12-03 LAB — COMPREHENSIVE METABOLIC PANEL
ALT: 17 U/L (ref 0–35)
AST: 19 U/L (ref 0–37)
Albumin: 4.2 g/dL (ref 3.5–5.2)
Alkaline Phosphatase: 61 U/L (ref 39–117)
BUN: 14 mg/dL (ref 6–23)
CO2: 26 mEq/L (ref 19–32)
Calcium: 9.3 mg/dL (ref 8.4–10.5)
Chloride: 106 mEq/L (ref 96–112)
Creatinine, Ser: 0.89 mg/dL (ref 0.40–1.20)
GFR: 77.33 mL/min (ref >60.00)
Glucose, Bld: 97 mg/dL (ref 70–99)
Potassium: 4.6 mEq/L (ref 3.5–5.1)
Sodium: 138 mEq/L (ref 135–145)
Total Bilirubin: 0.4 mg/dL (ref 0.2–1.2)
Total Protein: 7.2 g/dL (ref 6.0–8.3)

## 2021-12-03 LAB — HEMOGLOBIN A1C: Hgb A1c MFr Bld: 5.7 % (ref 4.6–6.5)

## 2021-12-03 MED ORDER — PROAIR RESPICLICK 108 (90 BASE) MCG/ACT IN AEPB: 2.0000 | INHALATION_SPRAY | Freq: Four times a day (QID) | RESPIRATORY_TRACT | 2 refills | Status: AC | PRN

## 2021-12-03 NOTE — Assessment & Plan Note (Signed)
Referral to plastic surgery made today for patient to consider undergoing evaluation of breast reduction surgery.

## 2021-12-03 NOTE — Assessment & Plan Note (Signed)
Chronic, order chest x-ray for further evaluation today.  Referral to pulmonology to consider undergoing PFTs.  Will prescribe albuterol inhaler to take as needed, she was encouraged especially to use 5 minutes prior to exercising.  Patient reports understanding.

## 2021-12-03 NOTE — Assessment & Plan Note (Signed)
Administer Tdap and flu vaccine, VIS provided for both.

## 2021-12-03 NOTE — Addendum Note (Signed)
Addended by: Delsa Grana R on: 12/03/2021 09:06 AM   Modules accepted: Orders

## 2021-12-03 NOTE — Assessment & Plan Note (Signed)
No specific lesion noted on scalp today, patient does have intermittent pruritus.  Referral to dermatology made today.  Patient continue using moisturizers as needed.

## 2021-12-03 NOTE — Assessment & Plan Note (Signed)
Chronic, per chart review last TSH and T4 within normal limits.  Patient to continue on Synthroid 150 mcg by mouth daily to follow-up with Dr. Elvera Lennox (endocrinologist) as scheduled.

## 2021-12-03 NOTE — Progress Notes (Signed)
New Patient Office Visit  Subjective    Patient ID: Rebecca Meza, female    DOB: December 02, 1975  Age: 46 y.o. MRN: [id number]  CC:  Chief Complaint  Patient presents with   Cough    HPI Rebecca Meza presents to establish care She has multiple complaints as listed below.  Reports last PCP visit was in 2018.  Shortly after that PCP moved to a different practice and she had not establish care until now.  Eczema/pruritic scalp: Reports history of eczema and intermittent pruritic area of frontal mid part of the scalp.  Currently uses moisturizers as well as cortisone cream as needed.  Would like to be evaluated by dermatology.  Cough: Chronic cough triggered by COVID-19 infection in June 2022, has lingered since then.  She reports coughing every day, feels that she has a hard time taking a deep breath in.  Has worsening shortness of breath with exercise.  Has a mom and a son who had been diagnosed with asthma.  Will use her son's albuterol inhaler intermittently and does report improvement in her symptoms.  Denies any history of smoking or exposure to smoking.  Cough is sometimes dry and sometimes will produce mucus.  Requesting breast reduction: Reports sometimes experiencing shortness of breath with laying down flat which she attributes to pressure from her breasts, also reports that she gets intermittent rash under her breast skin folds.  Would like to consider having breast reduction surgery.  Health maintenance: Due for flu shot and tetanus shot.  Also has not undergone colon cancer screening.  Hypothyroidism: On Synthroid 150 mcg/day.  Follows with endocrinology routinely.  Outpatient Encounter Medications as of 12/03/2021  Medication Sig   Albuterol Sulfate (PROAIR RESPICLICK) 108 (90 Base) MCG/ACT AEPB Inhale 2 puffs into the lungs every 6 (six) hours as needed.   SYNTHROID 150 MCG tablet Take 1 tablet (150 mcg total) by mouth daily with breakfast.   No facility-administered  encounter medications on file as of 12/03/2021.    Past Medical History:  Diagnosis Date   Allergy    oral antihistamine; seasonal.   Anxiety    Depression    Thyroid disease 01/19/2003   hypothyroidism    Past Surgical History:  Procedure Laterality Date   TONSILLECTOMY      Family History  Problem Relation Age of Onset   Cancer Father 27       pancreatic cancer   Hypothyroidism Mother    Breast cancer Cousin     Social History   Socioeconomic History   Marital status: Married    Spouse name: Not on file   Number of children: Not on file   Years of education: Not on file   Highest education level: Not on file  Occupational History   Not on file  Tobacco Use   Smoking status: Never   Smokeless tobacco: Never  Substance and Sexual Activity   Alcohol use: No   Drug use: No   Sexual activity: Yes    Birth control/protection: Condom  Other Topics Concern   Not on file  Social History Narrative   Marital status: married x 12 years; happily married; no abuse; moved from [place] in 2014.      Children two children/sons (11, 6)      Lives: with husband, two children/sons.      Employment:  Homemaker      Tobacco: none      Alcohol:  Socially; 2-3 glasses of wine.  Drugs: none      Exercise:   Swimming. Sporadic.       Seatbelt:  100%; no texting      Guns:  None      Sunscreen: SPF 50-70   Social Determinants of Corporate investment banker Strain: Not on file  Food Insecurity: Not on file  Transportation Needs: Not on file  Physical Activity: Not on file  Stress: Not on file  Social Connections: Not on file  Intimate Partner Violence: Not on file    ROS: See HPI      Objective    BP 130/82 (BP Location: Left Arm, Patient Position: Sitting, Cuff Size: Large)   Pulse (!) 55   Temp 98.4 F (36.9 C) (Oral)   Ht 5\' 4"  (1.626 m)   Wt 198 lb 12.8 oz (90.2 kg)   SpO2 98%   BMI 34.12 kg/m   Physical Exam Vitals reviewed.  Constitutional:       General: She is not in acute distress.    Appearance: Normal appearance.  HENT:     Head: Normocephalic and atraumatic.  Neck:     Vascular: No carotid bruit.  Cardiovascular:     Rate and Rhythm: Normal rate and regular rhythm.     Pulses: Normal pulses.     Heart sounds: Normal heart sounds.  Pulmonary:     Effort: Pulmonary effort is normal.     Breath sounds: Normal breath sounds.  Skin:    General: Skin is warm and dry.  Neurological:     General: No focal deficit present.     Mental Status: She is alert and oriented to person, place, and time.  Psychiatric:        Mood and Affect: Mood normal.        Behavior: Behavior normal.        Judgment: Judgment normal.         Assessment & Plan:   Problem List Items Addressed This Visit       Endocrine   Hypothyroidism    Chronic, per chart review last TSH and T4 within normal limits.  Patient to continue on Synthroid 150 mcg by mouth daily to follow-up with Dr. (endocrinologist) as scheduled.      Relevant Orders   Comprehensive metabolic panel     Musculoskeletal and Integument   Pruritic dermatosis of scalp    No specific lesion noted on scalp today, patient does have intermittent pruritus.  Referral to dermatology made today.  Patient continue using moisturizers as needed.        Other   Need for influenza vaccination - Primary    Administer Tdap and flu vaccine, VIS provided for both.      Relevant Orders   Flu Vaccine QUAD 6+ mos PF IM (Fluarix Quad PF) (Completed)   Ambulatory referral to Plastic Surgery   Chronic cough    Chronic, order chest x-ray for further evaluation today.  Referral to pulmonology to consider undergoing PFTs.  Will prescribe albuterol inhaler to take as needed, she was encouraged especially to use 5 minutes prior to exercising.  Patient reports understanding.      Relevant Medications   Albuterol Sulfate (PROAIR RESPICLICK) 108 (90 Base) MCG/ACT AEPB   Other Relevant  Orders   Ambulatory referral to Allergy   DG Chest 2 View   Comprehensive metabolic panel   CBC   Pendulous breast    Referral to plastic surgery made today for patient to  consider undergoing evaluation of breast reduction surgery.      Other Visit Diagnoses     Screening for diabetes mellitus       Relevant Orders   Hemoglobin A1c   Screening for cardiovascular condition       Relevant Orders   Lipid panel   Colon cancer screening       Relevant Orders   Ambulatory referral to Gastroenterology     In addition to the above we will order referral to gastroenterology to undergo colon cancer screening, we will also order labs to screen for diabetes and cardiovascular conditions.  Return in about 6 weeks (around 01/14/2022) for CPE with Virat Prather and office visit (40 minute appointment).   Elenore Paddy, NP

## 2021-12-25 ENCOUNTER — Encounter: Payer: Self-pay | Admitting: Plastic Surgery

## 2021-12-25 ENCOUNTER — Ambulatory Visit: Payer: 59 | Admitting: Plastic Surgery

## 2021-12-25 VITALS — BP 163/96 | HR 67 | Ht 64.0 in | Wt 198.2 lb

## 2021-12-25 DIAGNOSIS — M545 Low back pain, unspecified: Secondary | ICD-10-CM

## 2021-12-25 DIAGNOSIS — N62 Hypertrophy of breast: Secondary | ICD-10-CM | POA: Diagnosis not present

## 2021-12-25 DIAGNOSIS — M546 Pain in thoracic spine: Secondary | ICD-10-CM | POA: Diagnosis not present

## 2021-12-25 DIAGNOSIS — N6489 Other specified disorders of breast: Secondary | ICD-10-CM

## 2021-12-25 DIAGNOSIS — R21 Rash and other nonspecific skin eruption: Secondary | ICD-10-CM

## 2021-12-25 DIAGNOSIS — G8929 Other chronic pain: Secondary | ICD-10-CM

## 2021-12-25 DIAGNOSIS — M549 Dorsalgia, unspecified: Secondary | ICD-10-CM | POA: Insufficient documentation

## 2021-12-25 DIAGNOSIS — Z6834 Body mass index (BMI) 34.0-34.9, adult: Secondary | ICD-10-CM

## 2021-12-25 DIAGNOSIS — M542 Cervicalgia: Secondary | ICD-10-CM

## 2021-12-25 NOTE — Progress Notes (Signed)
Patient ID: Rebecca Meza, female    DOB: 1975/08/23, 46 y.o.   MRN: [id number]   Chief Complaint  Patient presents with   Advice Only   Breast Problem    Mammary Hyperplasia: The patient is a 46 y.o. female with a history of mammary hyperplasia for several years.  She has extremely large breasts causing symptoms that include the following: Back pain in the upper and lower back, including neck pain. She pulls or pins her bra straps to provide better lift and relief of the pressure and pain. She notices relief by holding her breast up manually.  Her shoulder straps cause grooves and pain and pressure that requires padding for relief. Pain medication is sometimes required with motrin and tylenol.  Activities that are hindered by enlarged breasts include: exercise and running.  She has tried supportive clothing as well as fitted bras without improvement.  Her breasts are extremely large and fairly symmetric.  She has hyperpigmentation of the inframammary area on both sides.  The sternal to nipple distance on the right is 34 cm and the left is 34 cm.  The IMF distance is 16 cm.  She is 5 feet 4 inches tall and weighs 198 pounds.  The BMI = 34 kg/m.  Preoperative bra size = E-F cup. She would like to be a B cup. The estimated excess breast tissue to be removed at the time of surgery = 620-640 grams on the left and 620-640 grams on the right.  Mammogram history: 1/23 and negative .  Family history of breast cancer:  none.  Tobacco use:  none.   The patient expresses the desire to pursue surgical intervention.     Review of Systems  Constitutional: Negative.   Eyes: Negative.   Respiratory: Negative.  Negative for chest tightness.   Cardiovascular: Negative.  Negative for leg swelling.  Gastrointestinal: Negative.   Endocrine: Negative.   Genitourinary: Negative.   Musculoskeletal:  Positive for back pain and neck pain.  Skin:  Positive for rash.  Hematological: Negative.    Psychiatric/Behavioral: Negative.      Past Medical History:  Diagnosis Date   Allergy    oral antihistamine; seasonal.   Anxiety    Depression    Thyroid disease 01/19/2003   hypothyroidism    Past Surgical History:  Procedure Laterality Date   TONSILLECTOMY        Current Outpatient Medications:    Albuterol Sulfate (PROAIR RESPICLICK) 108 (90 Base) MCG/ACT AEPB, Inhale 2 puffs into the lungs every 6 (six) hours as needed., Disp: 1 each, Rfl: 2   SYNTHROID 150 MCG tablet, Take 1 tablet (150 mcg total) by mouth daily with breakfast., Disp: 90 tablet, Rfl: 3   Objective:   Vitals:   12/25/21 0909  BP: (!) 163/96  Pulse: 67  SpO2: 99%    Physical Exam Vitals and nursing note reviewed.  Constitutional:      Appearance: Normal appearance.  HENT:     Head: Normocephalic and atraumatic.  Cardiovascular:     Rate and Rhythm: Normal rate.     Pulses: Normal pulses.  Pulmonary:     Effort: Pulmonary effort is normal.  Abdominal:     General: There is no distension.     Palpations: Abdomen is soft.     Tenderness: There is no abdominal tenderness.  Musculoskeletal:        General: No swelling or deformity.  Skin:    General: Skin is warm.  Capillary Refill: Capillary refill takes less than 2 seconds.     Coloration: Skin is not jaundiced.     Findings: No bruising.  Neurological:     Mental Status: She is alert and oriented to person, place, and time.  Psychiatric:        Mood and Affect: Mood normal.        Thought Content: Thought content normal.        Judgment: Judgment normal.     Assessment & Plan:  Symptomatic mammary hypertrophy  Chronic bilateral thoracic back pain  Neck pain  Pendulous breast  The procedure the patient selected and that was best for the patient was discussed. The risk were discussed and include but not limited to the following:  Breast asymmetry, fluid accumulation, firmness of the breast, inability to breast feed, loss of  nipple or areola, skin loss, change in skin and nipple sensation, fat necrosis of the breast tissue, bleeding, infection and healing delay.  There are risks of anesthesia and injury to nerves or blood vessels.  Allergic reaction to tape, suture and skin glue are possible.  There will be swelling.  Any of these can lead to the need for revisional surgery.  A breast reduction has potential to interfere with diagnostic procedures in the future.  This procedure is best done when the breast is fully developed.  Changes in the breast will continue to occur over time: pregnancy, weight gain or weigh loss.    Total time: 40 minutes. This includes time spent with the patient during the visit as well as time spent before and after the visit reviewing the chart, documenting the encounter, ordering pertinent studies and literature for the patient.   Physical therapy:  ordered Mammogram:  ordered for 1/24  Patient is a good candidate for bilateral breast reduction with possible liposuction.  Recommend that she do her physical therapy and get the mammogram in January.  I like to see her back after that and we will submit for preauthorization.  I think she be a good candidate for the surgery.  Pictures were obtained of the patient and placed in the chart with the patient's or guardian's permission.   [name] Demetrio Leighty, DO

## 2022-01-13 NOTE — Therapy (Signed)
OUTPATIENT PHYSICAL THERAPY THORACOLUMBAR EVALUATION   Patient Name: Rebecca Meza MRN: [id number] DOB:07-25-1975, 46 y.o., female Today's Date: 01/14/2022  END OF SESSION:  PT End of Session - 01/14/22 0937     Visit Number 1    Number of Visits 6    Date for PT Re-Evaluation 02/25/22    Authorization Type [employer]    PT Start Time 0912    PT Stop Time 0945    PT Time Calculation (min) 33 min    Activity Tolerance Patient tolerated treatment well    Behavior During Therapy [facility] for tasks assessed/performed             Past Medical History:  Diagnosis Date   Allergy    oral antihistamine; seasonal.   Anxiety    Depression    Thyroid disease 01/19/2003   hypothyroidism   Past Surgical History:  Procedure Laterality Date   TONSILLECTOMY     Patient Active Problem List   Diagnosis Date Noted   Symptomatic mammary hypertrophy 12/25/2021   Back pain 12/25/2021   Neck pain 12/25/2021   Need for influenza vaccination 12/03/2021   Chronic cough 12/03/2021   Pendulous breast 12/03/2021   Pruritic dermatosis of scalp 12/03/2021   Class 1 obesity 09/28/2016   Hypothyroidism 07/31/2013    PCP: Elenore Paddy, NP   REFERRING PROVIDER:   Peggye Form, DO    REFERRING DIAG:  N62 (ICD-10-CM) - Symptomatic mammary hypertrophy  M54.6,G89.29 (ICD-10-CM) - Chronic bilateral thoracic back pain  M54.2 (ICD-10-CM) - Neck pain  N64.89 (ICD-10-CM) - Pendulous breast    Rationale for Evaluation and Treatment: Rehabilitation  THERAPY DIAG:  Lumbar pain  Muscle weakness (generalized)  ONSET DATE: Many years  SUBJECTIVE:                                                                                                                                                                                           SUBJECTIVE STATEMENT: Pt reports primary c/o global back pain since she was a teenager. Her low back is her primary source of pain. She was referred by Dr.  Ulice Bold with a diagnosis of mammary hypertrophy and seeks six weeks of conservative treatment prior to breast reduction surgery.  Current pain is 1-2/10. Worst pain is 10/10. Best pain is 1-2/10. Aggravating factors include standing > 30 minutes. Easing factors include heat, massage, rest. Pt denies any unexplained weight change, bowel/ bladder changes, saddle anesthesia, N/T, or nausea/ vomiting.   PERTINENT HISTORY:  Anxiety, depression (not current), ADHD  PAIN:  Are you having pain? Yes: NPRS scale: 1-2/10 Pain location: low back > upper  back Pain description: Achy Aggravating factors: standing > 30 minutes Relieving factors: heat, massage, rest  PRECAUTIONS: None  WEIGHT BEARING RESTRICTIONS: No  FALLS:  Has patient fallen in last 6 months? 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  PLOF: Independent  PATIENT GOALS: Standing at son's swim meets   NEXT MD VISIT:   OBJECTIVE:   DIAGNOSTIC FINDINGS:  N/A  PATIENT SURVEYS:  Modified Oswestry 10/50, 20% disability   SCREENING FOR RED FLAGS: Bowel or bladder incontinence: No Cauda equina syndrome: No  COGNITION: Overall cognitive status: Within functional limits for tasks assessed     SENSATION: Not tested  MUSCLE LENGTH: Thomas test: Mild limitation BIL  POSTURE: increased lumbar lordosis  PALPATION: No TTP to BIL mid trap, lumbar/ thoracic paraspinals, palpable trigger points throughout these muscles  PASSIVE ACCESSORIES: CPA's hypomobile throughout thoracic and lumbar spine with pain at L3-L4  LUMBAR ROM:   AROM eval  Flexion WNL  Extension 75%  Right lateral flexion 75%  Left lateral flexion 75%  Right rotation WNL  Left rotation WNL   (Blank rows = not tested)   MMT:    MMT Right eval  Left eval  Hip flexion 3+/5 3+/5  Hip extension 3+/5 3+/5  Hip abduction 4+/5 4+/5  Low trap 3+/5 3+/5  Mid trap 3+/5 3+/5  Latissimus dorsi 3+/5 3+/5   (Blank rows = not tested)  LUMBAR SPECIAL TESTS:  PLE: (-)  FUNCTIONAL TESTS:  Forearm plank: 70 seconds Squat: WNL   TODAY'S TREATMENT:                                                                                                                               [facility] Adult PT Treatment:                                                DATE: 01/14/2022 Therapeutic Exercise: Discussed POC, prognosis, and HEP Manual Therapy: N/A Neuromuscular re-ed: N/A Therapeutic Activity: N/A Modalities: N/A Self Care: N/A    PATIENT EDUCATION:  Education details: Pt educated on potential underlying pathophysiology, POC, prognosis, ODI, and HEP Person educated: Patient Education method: Explanation, Demonstration, and Handouts Education comprehension: verbalized understanding and returned demonstration  HOME EXERCISE PROGRAM: Access Code: [email or web address] URL: [email or web address] Date: 01/14/2022 Prepared by: Carmelina Dane  Exercises - Oblique Crunch  - 1 x daily - 7 x weekly - 3 sets - 20 reps - Side Plank on Knees  - 1 x daily - 7 x weekly - 3 sets - 10 reps - Deadlift With Dumbbells  - 1 x daily - 7 x weekly - 3 sets - 10  reps - Seated Row [employer]  - 1 x daily - 7 x weekly - 3 sets - 10 reps - Standing Shoulder Row with Anchored Resistance  - 1 x daily - 7 x weekly - 3 sets - 10 reps  ASSESSMENT:  CLINICAL IMPRESSION: Patient is a 46 y.o. F who was seen today for physical therapy evaluation and treatment for chronic mid-to-low back pain. Upon assessment, her primary impairments include globally weak BIL parascapular musculature, globally weak BIL hip strength, hypomobile and painful lumbar passive accessories, tight BIL hip flexors, and limited trunk mobility in extension and BIL side bend. Ruling up  mechanical back pain. Pt will benefit from skilled PT to address her primary impairments and return to her prior level of function with less limitation.  OBJECTIVE IMPAIRMENTS: decreased activity tolerance, decreased endurance, decreased mobility, decreased ROM, decreased strength, hypomobility, impaired flexibility, improper body mechanics, postural dysfunction, and pain.   ACTIVITY LIMITATIONS: carrying, lifting, and standing  PARTICIPATION LIMITATIONS: cleaning, laundry, shopping, community activity, and yard work  PERSONAL FACTORS: Time since onset of injury/illness/exacerbation and 3+ comorbidities: See medical hx  are also affecting patient's functional outcome.   REHAB POTENTIAL: Fair Due to time since onset  CLINICAL DECISION MAKING: Stable/uncomplicated  EVALUATION COMPLEXITY: Low   GOALS: Goals reviewed with patient? Yes  SHORT TERM GOALS: Target date: 02/04/2022  Pt will report understanding and adherence to initial HEP in order to promote independence in the management of primary impairments. Baseline: HEP provided at eval Goal status: INITIAL    LONG TERM GOALS: Target date: 02/25/2022  Pt will achieve an ODI score of 8% or lower in order to demonstrate improved functional ability as it relates to the pt's primary impairments. Baseline: 20% Goal status: INITIAL  2.  Pt will report ability to stand 1 hour with 0-2/10 pain in order to participate at her son's swim meets with less limitation. Baseline: >3/10 pain with 30 minutes of standing  Goal status: INITIAL  3.  Pt will achieve BIL global hip strength of 4+/5 or greater in order to progress her independent strengthening regimen with less limitation. Baseline: See MMT chart Goal status: INITIAL  4.  Pt will achieve BIL global parascapular strength of 4+/5 or greater in order to promote dunctional strength in the prophylaxis of future mechanical mid/upper back pain. Baseline: See MMT chart Goal status:  INITIAL   PLAN:  PT FREQUENCY: 1x/week  PT DURATION: 6 weeks  PLANNED INTERVENTIONS: Therapeutic exercises, Therapeutic activity, Neuromuscular re-education, Balance training, Gait training, Patient/Family education, Self Care, Joint mobilization, Joint manipulation, Dry Needling, Electrical stimulation, Spinal manipulation, Spinal mobilization, Cryotherapy, Moist heat, Taping, Traction, Ionotophoresis 4mg /ml Dexamethasone, Manual therapy, and Re-evaluation.  PLAN FOR NEXT SESSION: Progress core/ parascapular strengthening   Carmelina Dane, PT, DPT 01/14/22 10:42 AM

## 2022-01-14 ENCOUNTER — Ambulatory Visit: Payer: 59 | Attending: Plastic Surgery

## 2022-01-14 ENCOUNTER — Other Ambulatory Visit: Payer: Self-pay

## 2022-01-14 ENCOUNTER — Ambulatory Visit: Payer: 59 | Admitting: Nurse Practitioner

## 2022-01-14 ENCOUNTER — Encounter: Payer: Self-pay | Admitting: Nurse Practitioner

## 2022-01-14 VITALS — BP 134/80 | HR 54 | Temp 97.9°F | Ht 64.0 in | Wt 191.2 lb

## 2022-01-14 DIAGNOSIS — M545 Low back pain, unspecified: Secondary | ICD-10-CM | POA: Insufficient documentation

## 2022-01-14 DIAGNOSIS — R7303 Prediabetes: Secondary | ICD-10-CM

## 2022-01-14 DIAGNOSIS — M6281 Muscle weakness (generalized): Secondary | ICD-10-CM | POA: Insufficient documentation

## 2022-01-14 DIAGNOSIS — M542 Cervicalgia: Secondary | ICD-10-CM | POA: Diagnosis not present

## 2022-01-14 DIAGNOSIS — N62 Hypertrophy of breast: Secondary | ICD-10-CM | POA: Diagnosis not present

## 2022-01-14 DIAGNOSIS — E669 Obesity, unspecified: Secondary | ICD-10-CM | POA: Diagnosis not present

## 2022-01-14 DIAGNOSIS — E785 Hyperlipidemia, unspecified: Secondary | ICD-10-CM | POA: Diagnosis not present

## 2022-01-14 DIAGNOSIS — M546 Pain in thoracic spine: Secondary | ICD-10-CM | POA: Diagnosis not present

## 2022-01-14 DIAGNOSIS — G8929 Other chronic pain: Secondary | ICD-10-CM | POA: Diagnosis not present

## 2022-01-14 DIAGNOSIS — Z0000 Encounter for general adult medical examination without abnormal findings: Secondary | ICD-10-CM

## 2022-01-14 DIAGNOSIS — Z0001 Encounter for general adult medical examination with abnormal findings: Secondary | ICD-10-CM

## 2022-01-14 DIAGNOSIS — Z6832 Body mass index (BMI) 32.0-32.9, adult: Secondary | ICD-10-CM | POA: Diagnosis not present

## 2022-01-14 DIAGNOSIS — N6489 Other specified disorders of breast: Secondary | ICD-10-CM | POA: Diagnosis not present

## 2022-01-14 NOTE — Assessment & Plan Note (Signed)
Recommended lifestyle modification specifically focusing on low glycemic index foods, eating lean proteins, and leafy green vegetables for main dietary sources.  Will monitor periodically by rechecking A1c.

## 2022-01-14 NOTE — Assessment & Plan Note (Signed)
Patient encouraged to follow healthy lifestyle including healthy diet and continue exercising.  Also discussed proper hydration with water daily.  Discussed health maintenance recommendations patient will see Dr. Lavonda Jumbo for Pap smear, have mammogram completed as scheduled in 1 month, and will follow-up with GI to get colon cancer screening completed.  Consider hepatitis C screening next time blood work is drawn.

## 2022-01-14 NOTE — Assessment & Plan Note (Addendum)
Discussed lifestyle modification as well as pharmacological treatment.  Per shared decision making patient will start with lifestyle modification.  I encouraged her to track calories, eat a total of 1100 to 1200 cal/day, and continue to participate in regular exercise.  Patient denies personal or family history of thyroid cancer.  She would potentially be a good candidate for Zepbound for weight loss if she chooses to try this.  Handout on this medication provided to patient today.

## 2022-01-14 NOTE — Assessment & Plan Note (Signed)
Based on ASCVD risk score recommend patient focus on lifestyle management.  We discussed specifically eating lean proteins and leafy green vegetables as majority of dietary intake.  Also discussed the importance of 150 minutes of exercise per week, which patient is already participating in, encouraged her to continue doing so.  Consider rechecking fasting panel in approximately 6 months.

## 2022-01-14 NOTE — Progress Notes (Signed)
Established Patient Office Visit  Subjective   Patient ID: Rebecca Meza, female    DOB: 1975-11-09  Age: 46 y.o. MRN: [id number]  Chief Complaint  Patient presents with   Annual Exam    Annual exam: Due for Pap smear, reports that she will go back to Dr. Terri Piedra for this to be completed.  Mammogram scheduled in approximately 1 month.  Planning on calling [facility] back to schedule screening colonoscopy.  Due for hepatitis C screening.  Hyperlipidemia/prediabetes/obesity: Labs at last office visit found evidence of hyperlipidemia with total cholesterol 221 and LDL 160, this was not a fasting panel.  Lipid panel in 2018 similar.  Patient not on pharmacological therapy at this time.  ASCVD risk score around 1.8%.  A1c of 5.7, patient is not interested in taking metformin.  Patient is actively trying to lose weight.  Reports exercising 4 to 5 hours/week, has been trying to reduce intake of food especially around dinnertime.  Has reduced intake of processed carbohydrates specifically reducing cereal.  Would like to discuss to tirzepatide for treatment of obesity.    Review of Systems  Constitutional:  Negative for fever, malaise/fatigue and weight loss.  Respiratory:  Negative for shortness of breath.   Cardiovascular:  Negative for chest pain and palpitations.  Gastrointestinal:  Negative for abdominal pain, blood in stool and constipation.  Genitourinary:  Negative for dysuria and hematuria.  Neurological:  Negative for dizziness, seizures, loss of consciousness and headaches.  Psychiatric/Behavioral:  Negative for depression and suicidal ideas. The patient is not nervous/anxious and does not have insomnia.       Objective:     BP 134/80   Pulse (!) 54   Temp 97.9 F (36.6 C) (Oral)   Ht 5\' 4"  (1.626 m)   Wt 191 lb 4 oz (86.8 kg)   SpO2 99%   BMI 32.83 kg/m  BP Readings from Last 3 Encounters:  01/14/22 134/80  12/25/21 (!) 163/96  12/03/21 130/82   Wt Readings from Last  3 Encounters:  01/14/22 191 lb 4 oz (86.8 kg)  12/25/21 198 lb 3.2 oz (89.9 kg)  12/03/21 198 lb 12.8 oz (90.2 kg)      Physical Exam Vitals reviewed.  Constitutional:      Appearance: Normal appearance.  HENT:     Head: Normocephalic and atraumatic.     Right Ear: Tympanic membrane, ear canal and external ear normal.     Left Ear: Tympanic membrane, ear canal and external ear normal.  Eyes:     General:        Right eye: No discharge.        Left eye: No discharge.     Extraocular Movements: Extraocular movements intact.     Conjunctiva/sclera: Conjunctivae normal.     Pupils: Pupils are equal, round, and reactive to light.  Neck:     Vascular: No carotid bruit.  Cardiovascular:     Rate and Rhythm: Normal rate and regular rhythm.     Pulses: Normal pulses.     Heart sounds: Normal heart sounds. No murmur heard. Pulmonary:     Effort: Pulmonary effort is normal.     Breath sounds: Normal breath sounds.  Chest:     Comments: Breast exam deferred per patient preference  Abdominal:     General: Abdomen is flat. Bowel sounds are normal. There is no distension.     Palpations: Abdomen is soft. There is no mass.     Tenderness: There is  no abdominal tenderness.  Musculoskeletal:        General: No tenderness.     Cervical back: Neck supple. No muscular tenderness.     Right lower leg: No edema.     Left lower leg: No edema.  Lymphadenopathy:     Cervical: No cervical adenopathy.     Upper Body:     Right upper body: No supraclavicular adenopathy.     Left upper body: No supraclavicular adenopathy.  Skin:    General: Skin is warm and dry.  Neurological:     General: No focal deficit present.     Mental Status: She is alert and oriented to person, place, and time.     Motor: No weakness.     Gait: Gait normal.  Psychiatric:        Mood and Affect: Mood normal.        Behavior: Behavior normal.        Judgment: Judgment normal.      No results found for any  visits on 01/14/22.    The 10-year ASCVD risk score (Arnett DK, et al., 2019) is: 1.8%    Assessment & Plan:   Problem List Items Addressed This Visit       Other   Encounter for general adult medical examination with abnormal findings - Primary    Patient encouraged to follow healthy lifestyle including healthy diet and continue exercising.  Also discussed proper hydration with water daily.  Discussed health maintenance recommendations patient will see Dr. Lavonda Jumbo for Pap smear, have mammogram completed as scheduled in 1 month, and will follow-up with GI to get colon cancer screening completed.  Consider hepatitis C screening next time blood work is drawn.      Hyperlipidemia    Based on ASCVD risk score recommend patient focus on lifestyle management.  We discussed specifically eating lean proteins and leafy green vegetables as majority of dietary intake.  Also discussed the importance of 150 minutes of exercise per week, which patient is already participating in, encouraged her to continue doing so.  Consider rechecking fasting panel in approximately 6 months.      Prediabetes    Recommended lifestyle modification specifically focusing on low glycemic index foods, eating lean proteins, and leafy green vegetables for main dietary sources.  Will monitor periodically by rechecking A1c.      Obesity    Discussed lifestyle modification as well as pharmacological treatment.  Per shared decision making patient will start with lifestyle modification.  I encouraged her to track calories, eat a total of 1100 to 1200 cal/day, and continue to participate in regular exercise.  Patient denies personal or family history of thyroid cancer.  She would potentially be a good candidate for Zepbound for weight loss if she chooses to try this.  Handout on this medication provided to patient today.       Return in about 6 months (around 07/16/2022) for F/u with Maralyn Sago.  In addition to performing her annual  exam also performed an office visit as detailed above.   Elenore Paddy, NP

## 2022-01-20 ENCOUNTER — Ambulatory Visit: Payer: 59 | Attending: Plastic Surgery

## 2022-01-20 DIAGNOSIS — M6281 Muscle weakness (generalized): Secondary | ICD-10-CM | POA: Diagnosis present

## 2022-01-20 DIAGNOSIS — M545 Low back pain, unspecified: Secondary | ICD-10-CM | POA: Insufficient documentation

## 2022-01-20 NOTE — Therapy (Signed)
OUTPATIENT PHYSICAL THERAPY TREATMENT NOTE   Patient Name: Rebecca Meza MRN: [id number] DOB:12-Jun-1975, 47 y.o., female Today's Date: 01/20/2022  PCP: Elenore Paddy, NP  REFERRING PROVIDER: Peggye Form, DO   END OF SESSION:   PT End of Session - 01/20/22 0956     Visit Number 2    Number of Visits 6    Date for PT Re-Evaluation 02/25/22    Authorization Type [employer]    PT Start Time 1000    PT Stop Time 1040    PT Time Calculation (min) 40 min    Activity Tolerance Patient tolerated treatment well    Behavior During Therapy [facility] for tasks assessed/performed             Past Medical History:  Diagnosis Date   Allergy    oral antihistamine; seasonal.   Anxiety    Depression    Thyroid disease 01/19/2003   hypothyroidism   Past Surgical History:  Procedure Laterality Date   TONSILLECTOMY     uterine ablation     Patient Active Problem List   Diagnosis Date Noted   Encounter for general adult medical examination with abnormal findings 01/14/2022   Hyperlipidemia 01/14/2022   Prediabetes 01/14/2022   Obesity 01/14/2022   Symptomatic mammary hypertrophy 12/25/2021   Back pain 12/25/2021   Neck pain 12/25/2021   Need for influenza vaccination 12/03/2021   Chronic cough 12/03/2021   Pendulous breast 12/03/2021   Pruritic dermatosis of scalp 12/03/2021   Class 1 obesity 09/28/2016   Hypothyroidism 07/31/2013    REFERRING DIAG:  N62 (ICD-10-CM) - Symptomatic mammary hypertrophy  M54.6,G89.29 (ICD-10-CM) - Chronic bilateral thoracic back pain  M54.2 (ICD-10-CM) - Neck pain  N64.89 (ICD-10-CM) - Pendulous breast    THERAPY DIAG:  Lumbar pain  Muscle weakness (generalized)  Rationale for Evaluation and Treatment Rehabilitation  PERTINENT HISTORY: Anxiety, depression (not current), ADHD    SUBJECTIVE:                                                                                                                                                                                       SUBJECTIVE STATEMENT:  Pt reports she has been adherent to her HEP. She denies any pain today.    PAIN:  Are you having pain? Yes: NPRS scale: 0/10 Pain location: low back > upper back Pain description: Achy Aggravating factors: standing > 30 minutes Relieving factors: heat, massage, rest   OBJECTIVE: (objective measures completed at initial evaluation unless otherwise dated)  DIAGNOSTIC FINDINGS:  N/A   PATIENT SURVEYS:  Modified Oswestry 10/50, 20% disability    SCREENING FOR RED FLAGS: Bowel or bladder  incontinence: No Cauda equina syndrome: No   COGNITION: Overall cognitive status: Within functional limits for tasks assessed                          SENSATION: Not tested   MUSCLE LENGTH: Thomas test: Mild limitation BIL   POSTURE: increased lumbar lordosis   PALPATION: No TTP to BIL mid trap, lumbar/ thoracic paraspinals, palpable trigger points throughout these muscles   PASSIVE ACCESSORIES: CPA's hypomobile throughout thoracic and lumbar spine with pain at L3-L4   LUMBAR ROM:    AROM eval  Flexion WNL  Extension 75%  Right lateral flexion 75%  Left lateral flexion 75%  Right rotation WNL  Left rotation WNL   (Blank rows = not tested)     MMT:     MMT Right eval Left eval  Hip flexion 3+/5 3+/5  Hip extension 3+/5 3+/5  Hip abduction 4+/5 4+/5  Low trap 3+/5 3+/5  Mid trap 3+/5 3+/5  Latissimus dorsi 3+/5 3+/5   (Blank rows = not tested)   LUMBAR SPECIAL TESTS:  PLE: (-)   FUNCTIONAL TESTS:  Forearm plank: 70 seconds Squat: WNL     TODAY'S TREATMENT:               [facility] Adult PT Treatment:                                                DATE: 01/20/2022 Therapeutic Exercise: Straight-arm reverse crunches with 2kg ball 3x15 Side knee plank with hip abduction 2x10 BIL Prone superman with hip abduction with GTB around thighs 3x10 Seated low rows with 55# 2x10 Seated high rows with 55# 2x10 Seated lat  pull-downs with 55# 2x10 Seated shoulder rolls 2x10 forward and backward Standing BIL shoulder scaption with 5# dumbbells with chin tuck into ball at wall 3x10 Bent-over rear delt flies with 5# dumbbells 3x10 Standing upper trap stretch x23min BIL Sidelying lumbar open book x10 BIL Manual Therapy: N/A Neuromuscular re-ed: N/A Therapeutic Activity: N/A Modalities: N/A Self Care: N/A                                                                                                                  [facility] Adult PT Treatment:                                                DATE: 01/14/2022 Therapeutic Exercise: Discussed POC, prognosis, and HEP Manual Therapy: N/A Neuromuscular re-ed: N/A Therapeutic Activity: N/A Modalities: N/A Self Care: N/A       PATIENT EDUCATION:  Education details: Pt educated on potential underlying pathophysiology, POC, prognosis, ODI, and HEP Person educated: Patient Education method: Explanation, Demonstration, and Handouts Education comprehension: verbalized  understanding and returned demonstration   HOME EXERCISE PROGRAM: Access Code: [email or web address] URL: [email or web address] Date: 01/14/2022 Prepared by: Vanessa Sylvania   Exercises - Oblique Crunch  - 1 x daily - 7 x weekly - 3 sets - 20 reps - Side Plank on Knees  - 1 x daily - 7 x weekly - 3 sets - 10 reps - Deadlift With Dumbbells  - 1 x daily - 7 x weekly - 3 sets - 10 reps - Seated Row Cable Machine  - 1 x daily - 7 x weekly - 3 sets - 10 reps - Standing Shoulder Row with Anchored Resistance  - 1 x daily - 7 x weekly - 3 sets - 10 reps   ASSESSMENT:   CLINICAL IMPRESSION: Pt responded well to initial interventions today, demonstrating good form and no increase in pain with selected exercises. She will continue to benefit from skilled PT to address her primary impairments and return to her prior level of function with less limitation.    OBJECTIVE IMPAIRMENTS: decreased  activity tolerance, decreased endurance, decreased mobility, decreased ROM, decreased strength, hypomobility, impaired flexibility, improper body mechanics, postural dysfunction, and pain.    ACTIVITY LIMITATIONS: carrying, lifting, and standing   PARTICIPATION LIMITATIONS: cleaning, laundry, shopping, community activity, and yard work   PERSONAL FACTORS: Time since onset of injury/illness/exacerbation and 3+ comorbidities: See medical hx  are also affecting patient's functional outcome.         GOALS: Goals reviewed with patient? Yes   SHORT TERM GOALS: Target date: 02/04/2022   Pt will report understanding and adherence to initial HEP in order to promote independence in the management of primary impairments. Baseline: HEP provided at eval Goal status: INITIAL       LONG TERM GOALS: Target date: 02/25/2022   Pt will achieve an ODI score of 8% or lower in order to demonstrate improved functional ability as it relates to the pt's primary impairments. Baseline: 20% Goal status: INITIAL   2.  Pt will report ability to stand 1 hour with 0-2/10 pain in order to participate at her son's swim meets with less limitation. Baseline: >3/10 pain with 30 minutes of standing  Goal status: INITIAL   3.  Pt will achieve BIL global hip strength of 4+/5 or greater in order to progress her independent strengthening regimen with less limitation. Baseline: See MMT chart Goal status: INITIAL   4.  Pt will achieve BIL global parascapular strength of 4+/5 or greater in order to promote dunctional strength in the prophylaxis of future mechanical mid/upper back pain. Baseline: See MMT chart Goal status: INITIAL     PLAN:   PT FREQUENCY: 1x/week   PT DURATION: 6 weeks   PLANNED INTERVENTIONS: Therapeutic exercises, Therapeutic activity, Neuromuscular re-education, Balance training, Gait training, Patient/Family education, Self Care, Joint mobilization, Joint manipulation, Dry Needling, Electrical  stimulation, Spinal manipulation, Spinal mobilization, Cryotherapy, Moist heat, Taping, Traction, Ionotophoresis 4mg /ml Dexamethasone, Manual therapy, and Re-evaluation.   PLAN FOR NEXT SESSION: Progress core/ parascapular strengthening   Vanessa Washburn, PT, DPT 01/20/22 10:40 AM

## 2022-01-29 ENCOUNTER — Ambulatory Visit: Payer: 59

## 2022-01-29 DIAGNOSIS — M545 Low back pain, unspecified: Secondary | ICD-10-CM | POA: Diagnosis not present

## 2022-01-29 DIAGNOSIS — M6281 Muscle weakness (generalized): Secondary | ICD-10-CM

## 2022-01-29 NOTE — Therapy (Signed)
OUTPATIENT PHYSICAL THERAPY TREATMENT NOTE   Patient Name: Rebecca Meza MRN: [id number] DOB:[birth year], 47 y.o., female Today's Date: 01/29/2022  PCP: Ailene Ards, NP  REFERRING PROVIDER: Wallace Going, DO   END OF SESSION:   PT End of Session - 01/29/22 0959     Visit Number 3    Number of Visits 6    Date for PT Re-Evaluation 02/25/22    Authorization Type [employer]et[employer]    PT Start Time 1000    PT Stop Time 1040    PT Time Calculation (min) 40 min    Activity Tolerance Patient tolerated treatment well    Behavior During Therapy [facility] for tasks assessed/performed              Past Medical History:  Diagnosis Date   Allergy    oral antihistamine; seasonal.   Anxiety    Depression    Thyroid disease 01/19/2003   hypothyroidism   Past Surgical History:  Procedure Laterality Date   TONSILLECTOMY     uterine ablation     Patient Active Problem List   Diagnosis Date Noted   Encounter for general adult medical examination with abnormal findings 01/14/2022   Hyperlipidemia 01/14/2022   Prediabetes 01/14/2022   Obesity 01/14/2022   Symptomatic mammary hypertrophy 12/25/2021   Back pain 12/25/2021   Neck pain 12/25/2021   Need for influenza vaccination 12/03/2021   Chronic cough 12/03/2021   Pendulous breast 12/03/2021   Pruritic dermatosis of scalp 12/03/2021   Class 1 obesity 09/28/2016   Hypothyroidism 07/31/2013    REFERRING DIAG:  N62 (ICD-10-CM) - Symptomatic mammary hypertrophy  M54.6,G89.29 (ICD-10-CM) - Chronic bilateral thoracic back pain  M54.2 (ICD-10-CM) - Neck pain  N64.89 (ICD-10-CM) - Pendulous breast    THERAPY DIAG:  Lumbar pain  Muscle weakness (generalized)  Rationale for Evaluation and Treatment Rehabilitation  PERTINENT HISTORY: Anxiety, depression (not current), ADHD    SUBJECTIVE:                                                                                                                                                                                       SUBJECTIVE STATEMENT:  Pt reports mild LBP today, adding that she has been unable to do her HEP regularly due to being sick last week.    PAIN:  Are you having pain? Yes: NPRS scale: 1/10 Pain location: low back > upper back Pain description: Achy Aggravating factors: standing > 30 minutes Relieving factors: heat, massage, rest   OBJECTIVE: (objective measures completed at initial evaluation unless otherwise dated)  DIAGNOSTIC FINDINGS:  N/A   PATIENT SURVEYS:  Modified Oswestry 10/50, 20% disability  SCREENING FOR RED FLAGS: Bowel or bladder incontinence: No Cauda equina syndrome: No   COGNITION: Overall cognitive status: Within functional limits for tasks assessed                          SENSATION: Not tested   MUSCLE LENGTH: Thomas test: Mild limitation BIL   POSTURE: increased lumbar lordosis   PALPATION: No TTP to BIL mid trap, lumbar/ thoracic paraspinals, palpable trigger points throughout these muscles   PASSIVE ACCESSORIES: CPA's hypomobile throughout thoracic and lumbar spine with pain at L3-L4   LUMBAR ROM:    AROM eval  Flexion WNL  Extension 75%  Right lateral flexion 75%  Left lateral flexion 75%  Right rotation WNL  Left rotation WNL   (Blank rows = not tested)     MMT:     MMT Right eval Left eval  Hip flexion 3+/5 3+/5  Hip extension 3+/5 3+/5  Hip abduction 4+/5 4+/5  Low trap 3+/5 3+/5  Mid trap 3+/5 3+/5  Latissimus dorsi 3+/5 3+/5   (Blank rows = not tested)   LUMBAR SPECIAL TESTS:  PLE: (-)   FUNCTIONAL TESTS:  Forearm plank: 70 seconds Squat: WNL     TODAY'S TREATMENT:               [facility] Adult PT Treatment:                                                DATE: 01/29/2022 Therapeutic Exercise: Side knee plank with top arm blue band row and top leg hip flexion 2x10 BIL Standing Pallof press with trunk rotation 2x10 BIL Standing golfer's stretch 2x10 Standing combined trunk  flexion/ rotation with 23# cable 3x10 BIL Standing windmill stretch 2x10 Standing trunk side bend with 30# cable 3x10 BIL Dead lift x10 with 40#, x10 with 50#, x10 with 57# DKTC strech x67min Lumbar open book stretch x10 BIL Manual Therapy: N/A Neuromuscular re-ed: N/A Therapeutic Activity: N/A Modalities: N/A Self Care: N/A   [facility] Adult PT Treatment:                                                DATE: 01/20/2022 Therapeutic Exercise: Straight-arm reverse crunches with 2kg ball 3x15 Side knee plank with hip abduction 2x10 BIL Prone superman with hip abduction with GTB around thighs 3x10 Seated low rows with 55# 2x10 Seated high rows with 55# 2x10 Seated lat pull-downs with 55# 2x10 Seated shoulder rolls 2x10 forward and backward Standing BIL shoulder scaption with 5# dumbbells with chin tuck into ball at wall 3x10 Bent-over rear delt flies with 5# dumbbells 3x10 Standing upper trap stretch x81min BIL Sidelying lumbar open book x10 BIL Manual Therapy: N/A Neuromuscular re-ed: N/A Therapeutic Activity: N/A Modalities: N/A Self Care: N/A  [facility] Adult PT Treatment:                                                DATE: 01/14/2022 Therapeutic Exercise: Discussed POC, prognosis, and HEP Manual Therapy: N/A Neuromuscular re-ed: N/A Therapeutic Activity: N/A Modalities: N/A Self Care: N/A       PATIENT EDUCATION:  Education details: Pt educated on potential underlying pathophysiology, POC, prognosis, ODI, and HEP Person educated: Patient Education method: Explanation, Demonstration, and Handouts Education comprehension: verbalized understanding and returned demonstration   HOME EXERCISE PROGRAM: Access Code: [email or web address] URL: [email or web address] Date: 01/14/2022 Prepared by: Carmelina Dane   Exercises - Oblique Crunch  - 1 x daily - 7  x weekly - 3 sets - 20 reps - Side Plank on Knees  - 1 x daily - 7 x weekly - 3 sets - 10 reps - Deadlift With Dumbbells  - 1 x daily - 7 x weekly - 3 sets - 10 reps - Seated Row Cable Machine  - 1 x daily - 7 x weekly - 3 sets - 10 reps - Standing Shoulder Row with Anchored Resistance  - 1 x daily - 7 x weekly - 3 sets - 10 reps   ASSESSMENT:   CLINICAL IMPRESSION: Pt continues to respond well to progressed exercises, reporting a therapeutic response to increased loading/ mobility exercises today. She will continue to benefit from skilled PT to address her primary impairments and return to her prior level of function with less limitation.   OBJECTIVE IMPAIRMENTS: decreased activity tolerance, decreased endurance, decreased mobility, decreased ROM, decreased strength, hypomobility, impaired flexibility, improper body mechanics, postural dysfunction, and pain.    ACTIVITY LIMITATIONS: carrying, lifting, and standing   PARTICIPATION LIMITATIONS: cleaning, laundry, shopping, community activity, and yard work   PERSONAL FACTORS: Time since onset of injury/illness/exacerbation and 3+ comorbidities: See medical hx  are also affecting patient's functional outcome.         GOALS: Goals reviewed with patient? Yes   SHORT TERM GOALS: Target date: 02/04/2022   Pt will report understanding and adherence to initial HEP in order to promote independence in the management of primary impairments. Baseline: HEP provided at eval Goal status: INITIAL       LONG TERM GOALS: Target date: 02/25/2022   Pt will achieve an ODI score of 8% or lower in order to demonstrate improved functional ability as it relates to the pt's primary impairments. Baseline: 20% Goal status: INITIAL   2.  Pt will report ability to stand 1 hour with 0-2/10 pain in order to participate at her son's swim meets with less limitation. Baseline: >3/10 pain with 30 minutes of standing  Goal status: INITIAL   3.  Pt will achieve  BIL global hip strength of 4+/5 or greater in order to progress her independent strengthening regimen with less limitation. Baseline: See MMT chart Goal status: INITIAL   4.  Pt will achieve BIL global parascapular strength of 4+/5 or greater in order to promote dunctional strength in the prophylaxis of future mechanical mid/upper back pain. Baseline: See MMT chart Goal status: INITIAL     PLAN:   PT FREQUENCY: 1x/week   PT DURATION: 6 weeks   PLANNED INTERVENTIONS: Therapeutic exercises, Therapeutic activity, Neuromuscular re-education, Balance training, Gait training, Patient/Family education, Self Care, Joint mobilization, Joint manipulation, Dry Needling, Electrical stimulation, Spinal manipulation, Spinal mobilization,  Cryotherapy, Moist heat, Taping, Traction, Ionotophoresis 4mg /ml Dexamethasone, Manual therapy, and Re-evaluation.   PLAN FOR NEXT SESSION: Progress core/ parascapular strengthening   Vanessa , PT, DPT 01/29/22 10:41 AM

## 2022-02-03 ENCOUNTER — Ambulatory Visit: Payer: 59

## 2022-02-03 DIAGNOSIS — M6281 Muscle weakness (generalized): Secondary | ICD-10-CM

## 2022-02-03 DIAGNOSIS — M545 Low back pain, unspecified: Secondary | ICD-10-CM

## 2022-02-03 NOTE — Therapy (Signed)
OUTPATIENT PHYSICAL THERAPY TREATMENT NOTE   Patient Name: Rebecca Meza MRN: [id number] DOB:Jul 18, 1975, 47 y.o., female Today's Date: 02/03/2022  PCP: Elenore Paddy, NP  REFERRING PROVIDER: Peggye Form, DO   END OF SESSION:   PT End of Session - 02/03/22 1001     Visit Number 4    Number of Visits 6    Date for PT Re-Evaluation 02/25/22    Authorization Type [employer]    PT Start Time 1001    PT Stop Time 1041    PT Time Calculation (min) 40 min    Activity Tolerance Patient tolerated treatment well    Behavior During Therapy [facility] for tasks assessed/performed               Past Medical History:  Diagnosis Date   Allergy    oral antihistamine; seasonal.   Anxiety    Depression    Thyroid disease 01/19/2003   hypothyroidism   Past Surgical History:  Procedure Laterality Date   TONSILLECTOMY     uterine ablation     Patient Active Problem List   Diagnosis Date Noted   Encounter for general adult medical examination with abnormal findings 01/14/2022   Hyperlipidemia 01/14/2022   Prediabetes 01/14/2022   Obesity 01/14/2022   Symptomatic mammary hypertrophy 12/25/2021   Back pain 12/25/2021   Neck pain 12/25/2021   Need for influenza vaccination 12/03/2021   Chronic cough 12/03/2021   Pendulous breast 12/03/2021   Pruritic dermatosis of scalp 12/03/2021   Class 1 obesity 09/28/2016   Hypothyroidism 07/31/2013    REFERRING DIAG:  N62 (ICD-10-CM) - Symptomatic mammary hypertrophy  M54.6,G89.29 (ICD-10-CM) - Chronic bilateral thoracic back pain  M54.2 (ICD-10-CM) - Neck pain  N64.89 (ICD-10-CM) - Pendulous breast    THERAPY DIAG:  Lumbar pain  Muscle weakness (generalized)  Rationale for Evaluation and Treatment Rehabilitation  PERTINENT HISTORY: Anxiety, depression (not current), ADHD    SUBJECTIVE:                                                                                                                                                                                       SUBJECTIVE STATEMENT:  Pt reports continues 1/10 LBP today. She reports HEP adherence.    PAIN:  Are you having pain? Yes: NPRS scale: 1/10 Pain location: low back > upper back Pain description: Achy Aggravating factors: standing > 30 minutes Relieving factors: heat, massage, rest   OBJECTIVE: (objective measures completed at initial evaluation unless otherwise dated)  DIAGNOSTIC FINDINGS:  N/A   PATIENT SURVEYS:  Modified Oswestry 10/50, 20% disability    SCREENING FOR RED FLAGS: Bowel or bladder incontinence: No  Cauda equina syndrome: No   COGNITION: Overall cognitive status: Within functional limits for tasks assessed                          SENSATION: Not tested   MUSCLE LENGTH: Thomas test: Mild limitation BIL   POSTURE: increased lumbar lordosis   PALPATION: No TTP to BIL mid trap, lumbar/ thoracic paraspinals, palpable trigger points throughout these muscles   PASSIVE ACCESSORIES: CPA's hypomobile throughout thoracic and lumbar spine with pain at L3-L4   LUMBAR ROM:    AROM eval  Flexion WNL  Extension 75%  Right lateral flexion 75%  Left lateral flexion 75%  Right rotation WNL  Left rotation WNL   (Blank rows = not tested)     MMT:     MMT Right eval Left eval Right 02/03/2022 Left 02/03/2022  Hip flexion 3+/5 3+/5 5/5 5/5  Hip extension 3+/5 3+/5 5/5 5/5  Hip abduction 4+/5 4+/5 5/5 5/5  Low trap 3+/5 3+/5 4/5 4/5  Mid trap 3+/5 3+/5 4+/5 4+/5  Latissimus dorsi 3+/5 3+/5 5/5 5/5   (Blank rows = not tested)   LUMBAR SPECIAL TESTS:  PLE: (-)   FUNCTIONAL TESTS:  Forearm plank: 70 seconds Squat: WNL     TODAY'S TREATMENT:           [facility] Adult PT Treatment:                                                DATE: 02/03/2022 Therapeutic Exercise: Squat into 3kg ball toss with outstretched arms 3x12 Bent-over rear delt flies with 4# dumbbells 3x10 Standing BIL scaption lifts with 4# dumbbells with chin  tuck hold 3x10 Standing diagonal abdominal press-down with 30# cable 2x10 BIL with short amortization phase Standing open book stretch 2x10 BIL Standing lat pull-downs with 27# cables 3x10 Standing lat stretch 2x30 seconds BIL Heels to heaven 3x15 Prone superman with GTB hip abduction 3x10 Hooklying piriformis stretch x44min BIL Manual Therapy: N/A Neuromuscular re-ed: N/A Therapeutic Activity: N/A Modalities: N/A Self Care: N/A       [facility] Adult PT Treatment:                                                DATE: 01/29/2022 Therapeutic Exercise: Side knee plank with top arm blue band row and top leg hip flexion 2x10 BIL Standing Pallof press with trunk rotation 2x10 BIL Standing golfer's stretch 2x10 Standing combined trunk flexion/ rotation with 23# cable 3x10 BIL Standing windmill stretch 2x10 Standing trunk side bend with 30# cable 3x10 BIL Dead lift x10 with 40#, x10 with 50#, x10 with 57# DKTC strech x51min Lumbar open book stretch x10 BIL Manual Therapy: N/A Neuromuscular re-ed: N/A Therapeutic Activity: N/A Modalities: N/A Self Care: N/A   [facility] Adult PT Treatment:                                                DATE: 01/20/2022 Therapeutic Exercise: Straight-arm reverse crunches with 2kg ball 3x15 Side knee plank with hip abduction 2x10 BIL Prone  superman with hip abduction with GTB around thighs 3x10 Seated low rows with 55# 2x10 Seated high rows with 55# 2x10 Seated lat pull-downs with 55# 2x10 Seated shoulder rolls 2x10 forward and backward Standing BIL shoulder scaption with 5# dumbbells with chin tuck into ball at wall 3x10 Bent-over rear delt flies with 5# dumbbells 3x10 Standing upper trap stretch x62min BIL Sidelying lumbar open book x10 BIL Manual Therapy: N/A Neuromuscular re-ed: N/A Therapeutic Activity: N/A Modalities: N/A Self Care: N/A                                                                                                                         PATIENT EDUCATION:  Education details: Pt educated on potential underlying pathophysiology, POC, prognosis, ODI, and HEP Person educated: Patient Education method: Explanation, Demonstration, and Handouts Education comprehension: verbalized understanding and returned demonstration   HOME EXERCISE PROGRAM: Access Code: [email or web address] URL: [email or web address] Date: 01/14/2022 Prepared by: Vanessa Hyrum   Exercises - Oblique Crunch  - 1 x daily - 7 x weekly - 3 sets - 20 reps - Side Plank on Knees  - 1 x daily - 7 x weekly - 3 sets - 10 reps - Deadlift With Dumbbells  - 1 x daily - 7 x weekly - 3 sets - 10 reps - Seated Row Cable Machine  - 1 x daily - 7 x weekly - 3 sets - 10 reps - Standing Shoulder Row with Anchored Resistance  - 1 x daily - 7 x weekly - 3 sets - 10 reps   ASSESSMENT:   CLINICAL IMPRESSION: Pt continues to progress well with PT, demonstrating good form and no pain with exercise progressions. Upon re-assessment of hip and shoulder strength, she has made excellent improvement. The pt will continue to benefit from skilled PT to address her primary impairments and return to her prior level of function with less limitation.    OBJECTIVE IMPAIRMENTS: decreased activity tolerance, decreased endurance, decreased mobility, decreased ROM, decreased strength, hypomobility, impaired flexibility, improper body mechanics, postural dysfunction, and pain.    ACTIVITY LIMITATIONS: carrying, lifting, and standing   PARTICIPATION LIMITATIONS: cleaning, laundry, shopping, community activity, and yard work   PERSONAL FACTORS: Time since onset of injury/illness/exacerbation and 3+ comorbidities: See medical hx  are also affecting patient's functional outcome.         GOALS: Goals reviewed with patient? Yes   SHORT TERM GOALS: Target date: 02/04/2022   Pt will report understanding and adherence to initial HEP in order to promote independence in the  management of primary impairments. Baseline: HEP provided at eval 02/03/2022: Pt reports HEP adherence Goal status: ACHIEVED       LONG TERM GOALS: Target date: 02/25/2022   Pt will achieve an ODI score of 8% or lower in order to demonstrate improved functional ability as it relates to the pt's primary impairments. Baseline: 20% Goal status: INITIAL   2.  Pt will report ability to stand 1 hour with 0-2/10 pain in  order to participate at her son's swim meets with less limitation. Baseline: >3/10 pain with 30 minutes of standing  Goal status: INITIAL   3.  Pt will achieve BIL global hip strength of 4+/5 or greater in order to progress her independent strengthening regimen with less limitation. Baseline: See MMT chart 02/03/2022: 5/5 global BIL hip strength Goal status: ACHIEVED   4.  Pt will achieve BIL global parascapular strength of 4+/5 or greater in order to promote dunctional strength in the prophylaxis of future mechanical mid/upper back pain. Baseline: See MMT chart 02/03/2022: See updated MMT chart Goal status: NEARLY MET     PLAN:   PT FREQUENCY: 1x/week   PT DURATION: 6 weeks   PLANNED INTERVENTIONS: Therapeutic exercises, Therapeutic activity, Neuromuscular re-education, Balance training, Gait training, Patient/Family education, Self Care, Joint mobilization, Joint manipulation, Dry Needling, Electrical stimulation, Spinal manipulation, Spinal mobilization, Cryotherapy, Moist heat, Taping, Traction, Ionotophoresis 4mg /ml Dexamethasone, Manual therapy, and Re-evaluation.   PLAN FOR NEXT SESSION: Progress core/ parascapular strengthening   Vanessa [place], PT, DPT 02/03/22 10:42 AM

## 2022-02-10 ENCOUNTER — Ambulatory Visit: Payer: 59

## 2022-02-10 DIAGNOSIS — M545 Low back pain, unspecified: Secondary | ICD-10-CM | POA: Diagnosis not present

## 2022-02-10 DIAGNOSIS — M6281 Muscle weakness (generalized): Secondary | ICD-10-CM

## 2022-02-10 NOTE — Therapy (Signed)
OUTPATIENT PHYSICAL THERAPY TREATMENT NOTE   Patient Name: Rebecca Meza MRN: [id number] DOB:[birth year], 47 y.o., female Today's Date: 02/10/2022  PCP: Elenore Paddy, NP  REFERRING PROVIDER: Peggye Form, DO   END OF SESSION:   PT End of Session - 02/10/22 1000     Visit Number 5    Number of Visits 6    Date for PT Re-Evaluation 02/25/22    Authorization Type [employer]    PT Start Time 1000    PT Stop Time 1040    PT Time Calculation (min) 40 min    Activity Tolerance Patient tolerated treatment well    Behavior During Therapy [facility] for tasks assessed/performed                Past Medical History:  Diagnosis Date   Allergy    oral antihistamine; seasonal.   Anxiety    Depression    Thyroid disease 01/19/2003   hypothyroidism   Past Surgical History:  Procedure Laterality Date   TONSILLECTOMY     uterine ablation     Patient Active Problem List   Diagnosis Date Noted   Encounter for general adult medical examination with abnormal findings 01/14/2022   Hyperlipidemia 01/14/2022   Prediabetes 01/14/2022   Obesity 01/14/2022   Symptomatic mammary hypertrophy 12/25/2021   Back pain 12/25/2021   Neck pain 12/25/2021   Need for influenza vaccination 12/03/2021   Chronic cough 12/03/2021   Pendulous breast 12/03/2021   Pruritic dermatosis of scalp 12/03/2021   Class 1 obesity 09/28/2016   Hypothyroidism 07/31/2013    REFERRING DIAG:  N62 (ICD-10-CM) - Symptomatic mammary hypertrophy  M54.6,G89.29 (ICD-10-CM) - Chronic bilateral thoracic back pain  M54.2 (ICD-10-CM) - Neck pain  N64.89 (ICD-10-CM) - Pendulous breast    THERAPY DIAG:  Lumbar pain  Muscle weakness (generalized)  Rationale for Evaluation and Treatment Rehabilitation  PERTINENT HISTORY: Anxiety, depression (not current), ADHD    SUBJECTIVE:                                                                                                                                                                                       SUBJECTIVE STATEMENT:  Pt reports continued 1/10 LBP and BIL shoulder pain today. She reports continued HEP adherence.    PAIN:  Are you having pain? Yes: NPRS scale: 1/10 Pain location: low back > upper back Pain description: Achy Aggravating factors: standing > 30 minutes Relieving factors: heat, massage, rest   OBJECTIVE: (objective measures completed at initial evaluation unless otherwise dated)  DIAGNOSTIC FINDINGS:  N/A   PATIENT SURVEYS:  Modified Oswestry 10/50, 20% disability    SCREENING FOR RED  FLAGS: Bowel or bladder incontinence: No Cauda equina syndrome: No   COGNITION: Overall cognitive status: Within functional limits for tasks assessed                          SENSATION: Not tested   MUSCLE LENGTH: Thomas test: Mild limitation BIL   POSTURE: increased lumbar lordosis   PALPATION: No TTP to BIL mid trap, lumbar/ thoracic paraspinals, palpable trigger points throughout these muscles   PASSIVE ACCESSORIES: CPA's hypomobile throughout thoracic and lumbar spine with pain at L3-L4   LUMBAR ROM:    AROM eval  Flexion WNL  Extension 75%  Right lateral flexion 75%  Left lateral flexion 75%  Right rotation WNL  Left rotation WNL   (Blank rows = not tested)     MMT:     MMT Right eval Left eval Right 02/03/2022 Left 02/03/2022  Hip flexion 3+/5 3+/5 5/5 5/5  Hip extension 3+/5 3+/5 5/5 5/5  Hip abduction 4+/5 4+/5 5/5 5/5  Low trap 3+/5 3+/5 4/5 4/5  Mid trap 3+/5 3+/5 4+/5 4+/5  Latissimus dorsi 3+/5 3+/5 5/5 5/5   (Blank rows = not tested)   LUMBAR SPECIAL TESTS:  PLE: (-)   FUNCTIONAL TESTS:  Forearm plank: 70 seconds Squat: WNL     TODAY'S TREATMENT:           [facility] Adult PT Treatment:                                                DATE: 02/10/2022 Therapeutic Exercise: Straight-arm cross crunches with 3kg ball 3x16 Forearm plank with 15# plate x3 to failure Child's pose with thread the  needle 3x10  Cobra stretch x70min Squat into SLS and contralateral hip ER with GTB around thighs and holding 3kg ball 2x20 Standing BIL scaption lifts with 5# dumbbells and chin tuck into ball at wall 3x10 Standing shoulder rolls with 5# dumbbells 3x10 forward and backward Straight-leg dead lift with 40# cable 3x10 Standing golfer's stretch 3x10 Manual Therapy: N/A Neuromuscular re-ed: N/A Therapeutic Activity: N/A Modalities: N/A Self Care: N/A   [facility] Adult PT Treatment:                                                DATE: 02/03/2022 Therapeutic Exercise: Squat into 3kg ball toss with outstretched arms 3x12 Bent-over rear delt flies with 4# dumbbells 3x10 Standing BIL scaption lifts with 4# dumbbells with chin tuck hold 3x10 Standing diagonal abdominal press-down with 30# cable 2x10 BIL with short amortization phase Standing open book stretch 2x10 BIL Standing lat pull-downs with 27# cables 3x10 Standing lat stretch 2x30 seconds BIL Heels to heaven 3x15 Prone superman with GTB hip abduction 3x10 Hooklying piriformis stretch x15min BIL Manual Therapy: N/A Neuromuscular re-ed: N/A Therapeutic Activity: N/A Modalities: N/A Self Care: N/A       [facility] Adult PT Treatment:                                                DATE: 01/29/2022 Therapeutic Exercise: Side knee plank  with top arm blue band row and top leg hip flexion 2x10 BIL Standing Pallof press with trunk rotation 2x10 BIL Standing golfer's stretch 2x10 Standing combined trunk flexion/ rotation with 23# cable 3x10 BIL Standing windmill stretch 2x10 Standing trunk side bend with 30# cable 3x10 BIL Dead lift x10 with 40#, x10 with 50#, x10 with 57# DKTC strech x6min Lumbar open book stretch x10 BIL Manual Therapy: N/A Neuromuscular re-ed: N/A Therapeutic Activity: N/A Modalities: N/A Self Care: N/A       PATIENT EDUCATION:  Education details: Pt educated on potential underlying pathophysiology, POC,  prognosis, ODI, and HEP Person educated: Patient Education method: Explanation, Demonstration, and Handouts Education comprehension: verbalized understanding and returned demonstration   HOME EXERCISE PROGRAM: Access Code: [email or web address] URL: [email or web address] Date: 01/14/2022 Prepared by: Vanessa Bee   Exercises - Oblique Crunch  - 1 x daily - 7 x weekly - 3 sets - 20 reps - Side Plank on Knees  - 1 x daily - 7 x weekly - 3 sets - 10 reps - Deadlift With Dumbbells  - 1 x daily - 7 x weekly - 3 sets - 10 reps - Seated Row Cable Machine  - 1 x daily - 7 x weekly - 3 sets - 10 reps - Standing Shoulder Row with Anchored Resistance  - 1 x daily - 7 x weekly - 3 sets - 10 reps   ASSESSMENT:   CLINICAL IMPRESSION: Pt continues to progress well with PT, demonstrating good form and no pain throughout the session. She will continue to benefit from skilled PT to address her primary impairments and return to her prior level of function with less limitation.    OBJECTIVE IMPAIRMENTS: decreased activity tolerance, decreased endurance, decreased mobility, decreased ROM, decreased strength, hypomobility, impaired flexibility, improper body mechanics, postural dysfunction, and pain.    ACTIVITY LIMITATIONS: carrying, lifting, and standing   PARTICIPATION LIMITATIONS: cleaning, laundry, shopping, community activity, and yard work   PERSONAL FACTORS: Time since onset of injury/illness/exacerbation and 3+ comorbidities: See medical hx  are also affecting patient's functional outcome.         GOALS: Goals reviewed with patient? Yes   SHORT TERM GOALS: Target date: 02/04/2022   Pt will report understanding and adherence to initial HEP in order to promote independence in the management of primary impairments. Baseline: HEP provided at eval 02/03/2022: Pt reports HEP adherence Goal status: ACHIEVED       LONG TERM GOALS: Target date: 02/25/2022   Pt will achieve an ODI score of  8% or lower in order to demonstrate improved functional ability as it relates to the pt's primary impairments. Baseline: 20% Goal status: INITIAL   2.  Pt will report ability to stand 1 hour with 0-2/10 pain in order to participate at her son's swim meets with less limitation. Baseline: >3/10 pain with 30 minutes of standing  Goal status: INITIAL   3.  Pt will achieve BIL global hip strength of 4+/5 or greater in order to progress her independent strengthening regimen with less limitation. Baseline: See MMT chart 02/03/2022: 5/5 global BIL hip strength Goal status: ACHIEVED   4.  Pt will achieve BIL global parascapular strength of 4+/5 or greater in order to promote dunctional strength in the prophylaxis of future mechanical mid/upper back pain. Baseline: See MMT chart 02/03/2022: See updated MMT chart Goal status: NEARLY MET     PLAN:   PT FREQUENCY: 1x/week   PT DURATION: 6 weeks   PLANNED INTERVENTIONS: Therapeutic exercises,  Therapeutic activity, Neuromuscular re-education, Balance training, Gait training, Patient/Family education, Self Care, Joint mobilization, Joint manipulation, Dry Needling, Electrical stimulation, Spinal manipulation, Spinal mobilization, Cryotherapy, Moist heat, Taping, Traction, Ionotophoresis 4mg /ml Dexamethasone, Manual therapy, and Re-evaluation.   PLAN FOR NEXT SESSION: Progress core/ parascapular strengthening   , PT, DPT 02/10/22 10:42 AM

## 2022-02-17 ENCOUNTER — Ambulatory Visit: Payer: 59 | Admitting: Physical Therapy

## 2022-02-17 ENCOUNTER — Encounter: Payer: Self-pay | Admitting: Physical Therapy

## 2022-02-17 DIAGNOSIS — M545 Low back pain, unspecified: Secondary | ICD-10-CM

## 2022-02-17 DIAGNOSIS — M6281 Muscle weakness (generalized): Secondary | ICD-10-CM

## 2022-02-17 NOTE — Therapy (Signed)
PHYSICAL THERAPY DISCHARGE SUMMARY  Visits from Start of Care: 6  Current functional level related to goals / functional outcomes: See assessment/goals   Remaining deficits: See assessment/goals   Education / Equipment: HEP and D/C plans  Patient agrees to discharge. Patient goals were partially met. Patient is being discharged due to meeting the stated rehab goals.   Patient Name: Rebecca Meza MRN: [id number] DOB:1975-11-07, 47 y.o., female Today's Date: 02/17/2022  PCP: Ailene Ards, NP  REFERRING PROVIDER: Wallace Going, DO   END OF SESSION:   PT End of Session - 02/17/22 0959     Visit Number 6    Number of Visits 6    Date for PT Re-Evaluation 02/25/22    Authorization Type [employer]    PT Start Time 1000    PT Stop Time 1041    PT Time Calculation (min) 41 min    Activity Tolerance Patient tolerated treatment well    Behavior During Therapy [facility] for tasks assessed/performed                Past Medical History:  Diagnosis Date   Allergy    oral antihistamine; seasonal.   Anxiety    Depression    Thyroid disease 01/19/2003   hypothyroidism   Past Surgical History:  Procedure Laterality Date   TONSILLECTOMY     uterine ablation     Patient Active Problem List   Diagnosis Date Noted   Encounter for general adult medical examination with abnormal findings 01/14/2022   Hyperlipidemia 01/14/2022   Prediabetes 01/14/2022   Obesity 01/14/2022   Symptomatic mammary hypertrophy 12/25/2021   Back pain 12/25/2021   Neck pain 12/25/2021   Need for influenza vaccination 12/03/2021   Chronic cough 12/03/2021   Pendulous breast 12/03/2021   Pruritic dermatosis of scalp 12/03/2021   Class 1 obesity 09/28/2016   Hypothyroidism 07/31/2013    REFERRING DIAG:  N62 (ICD-10-CM) - Symptomatic mammary hypertrophy  M54.6,G89.29 (ICD-10-CM) - Chronic bilateral thoracic back pain  M54.2 (ICD-10-CM) - Neck pain  N64.89 (ICD-10-CM) - Pendulous breast     THERAPY DIAG:  Lumbar pain  Muscle weakness (generalized)  Rationale for Evaluation and Treatment Rehabilitation  PERTINENT HISTORY: Anxiety, depression (not current), ADHD    SUBJECTIVE:                                                                                                                                                                                      SUBJECTIVE STATEMENT:  Pt reports continued 1/10 LBP and BIL shoulder pain today. She reports continued HEP adherence.    PAIN:  Are you having pain? Yes: NPRS scale: 1/10  Pain location: low back > upper back Pain description: Achy Aggravating factors: standing > 30 minutes Relieving factors: heat, massage, rest   OBJECTIVE: (objective measures completed at initial evaluation unless otherwise dated)  DIAGNOSTIC FINDINGS:  N/A   PATIENT SURVEYS:  Modified Oswestry 10/50, 20% disability    SCREENING FOR RED FLAGS: Bowel or bladder incontinence: No Cauda equina syndrome: No   COGNITION: Overall cognitive status: Within functional limits for tasks assessed                          SENSATION: Not tested   MUSCLE LENGTH: Thomas test: Mild limitation BIL   POSTURE: increased lumbar lordosis   PALPATION: No TTP to BIL mid trap, lumbar/ thoracic paraspinals, palpable trigger points throughout these muscles   PASSIVE ACCESSORIES: CPA's hypomobile throughout thoracic and lumbar spine with pain at L3-L4   LUMBAR ROM:    AROM eval  Flexion WNL  Extension 75%  Right lateral flexion 75%  Left lateral flexion 75%  Right rotation WNL  Left rotation WNL   (Blank rows = not tested)     MMT:     MMT Right eval Left eval Right 02/03/2022 Left 02/03/2022  Hip flexion 3+/5 3+/5 5/5 5/5  Hip extension 3+/5 3+/5 5/5 5/5  Hip abduction 4+/5 4+/5 5/5 5/5  Low trap 3+/5 3+/5 4/5 4/5  Mid trap 3+/5 3+/5 4+/5 4+/5  Latissimus dorsi 3+/5 3+/5 5/5 5/5   (Blank rows = not tested)   LUMBAR SPECIAL TESTS:   PLE: (-)   FUNCTIONAL TESTS:  Forearm plank: 70 seconds Squat: WNL     TODAY'S TREATMENT:           [facility] Adult PT Treatment:                                                DATE: 02/17/2022 Therapeutic Exercise: Elliptical 5 m while taking subjective and planning session with patient Straight-arm cross crunches with 3kg ball 3x16 Forearm plank with 15# plate x3 to failure Child's pose with thread the needle 3x10  Cobra stretch x29min Standing BIL scaption lifts with 5# dumbbells and chin tuck into ball at wall 3x10 Standing shoulder rolls with 5# dumbbells 3x10 forward and backward Straight-leg dead lift with 40# cable 3x10 Standing golfer's stretch 3x10  PATIENT EDUCATION:  Education details: Pt educated on potential underlying pathophysiology, POC, prognosis, ODI, and HEP Person educated: Patient Education method: Consulting civil engineer, Demonstration, and Handouts Education comprehension: verbalized understanding and returned demonstration   HOME EXERCISE PROGRAM: Access Code: [email or web address] URL: [email or web address] Date: 01/14/2022 Prepared by: Vanessa Eudora   Exercises - Oblique Crunch  - 1 x daily - 7 x weekly - 3 sets - 20 reps - Side Plank on Knees  - 1 x daily - 7 x weekly - 3 sets - 10 reps - Deadlift With Dumbbells  - 1 x daily - 7 x weekly - 3 sets - 10 reps - Seated Row Cable Machine  - 1 x daily - 7 x weekly - 3 sets - 10 reps - Standing Shoulder Row with Anchored Resistance  - 1 x daily - 7 x weekly - 3 sets - 10 reps   ASSESSMENT:   CLINICAL IMPRESSION: Rebecca Meza has progressed well with therapy.  Improved impairments include: periscapular strength, core strength, low back pain.  Functional improvements include: longer standing tolerance.  Progressions needed include: continued work at home with HEP.  Barriers to progress include: chronic shoulder/thoracic pain.  Pt with improved low back pain following therapy.  Pt with continued report of mid  back/shoulder pain which remains largely unchanged following therapy.  Please see GOALS section for progress on short term and long term goals established at evaluation.  I recommend D/C home with HEP; pt agrees with plan.   OBJECTIVE IMPAIRMENTS: decreased activity tolerance, decreased endurance, decreased mobility, decreased ROM, decreased strength, hypomobility, impaired flexibility, improper body mechanics, postural dysfunction, and pain.    ACTIVITY LIMITATIONS: carrying, lifting, and standing   PARTICIPATION LIMITATIONS: cleaning, laundry, shopping, community activity, and yard work   PERSONAL FACTORS: Time since onset of injury/illness/exacerbation and 3+ comorbidities: See medical hx  are also affecting patient's functional outcome.         GOALS: Goals reviewed with patient? Yes   SHORT TERM GOALS: Target date: 02/04/2022   Pt will report understanding and adherence to initial HEP in order to promote independence in the management of primary impairments. Baseline: HEP provided at eval 02/03/2022: Pt reports HEP adherence Goal status: ACHIEVED       LONG TERM GOALS: Target date: 02/25/2022   Pt will achieve an ODI score of 8% or lower in order to demonstrate improved functional ability as it relates to the pt's primary impairments. Baseline: 20% Modified Oswestry Low Back Pain Disability Questionnaire: 3 / 50 = 6.0 % Goal status: MET   2.  Pt will report ability to stand 1 hour with 0-2/10 pain in order to participate at her son's swim meets with less limitation. Baseline: >3/10 pain with 30 minutes of standing  1/31: 2/10 45 min, but no longer Goal status: Not met   3.  Pt will achieve BIL global hip strength of 4+/5 or greater in order to progress her independent strengthening regimen with less limitation. Baseline: See MMT chart 02/03/2022: 5/5 global BIL hip strength Goal status: ACHIEVED   4.  Pt will achieve BIL global parascapular strength of 4+/5 or greater in  order to promote dunctional strength in the prophylaxis of future mechanical mid/upper back pain. Baseline: See MMT chart 02/03/2022: See updated MMT chart Goal status: partially met     PLAN:   PT FREQUENCY: 1x/week   PT DURATION: 6 weeks   PLANNED INTERVENTIONS: Therapeutic exercises, Therapeutic activity, Neuromuscular re-education, Balance training, Gait training, Patient/Family education, Self Care, Joint mobilization, Joint manipulation, Dry Needling, Electrical stimulation, Spinal manipulation, Spinal mobilization, Cryotherapy, Moist heat, Taping, Traction, Ionotophoresis 4mg /ml Dexamethasone, Manual therapy, and Re-evaluation.   PLAN FOR NEXT SESSION: Progress core/ parascapular strengthening   Kevan Ny Anysia Choi PT 02/17/22 10:46 AM

## 2022-02-18 ENCOUNTER — Ambulatory Visit
Admission: RE | Admit: 2022-02-18 | Discharge: 2022-02-18 | Disposition: A | Discharge status: Home / Self Care | Payer: 59 | Source: Ambulatory Visit | Attending: Plastic Surgery | Admitting: Plastic Surgery

## 2022-02-18 DIAGNOSIS — N62 Hypertrophy of breast: Secondary | ICD-10-CM

## 2022-02-18 DIAGNOSIS — N6489 Other specified disorders of breast: Secondary | ICD-10-CM

## 2022-02-18 DIAGNOSIS — M542 Cervicalgia: Secondary | ICD-10-CM

## 2022-02-18 DIAGNOSIS — G8929 Other chronic pain: Secondary | ICD-10-CM

## 2022-02-24 ENCOUNTER — Telehealth: Payer: Self-pay | Admitting: Plastic Surgery

## 2022-02-24 NOTE — Progress Notes (Unsigned)
Referring Provider Ailene Ards, NP [place],  [place] [address]   CC: No chief complaint on file.     Rebecca Meza is an 47 y.o. female.  HPI: Patient is a 47 y.o. year old female here for follow up after completing physical therapy for pain related to macromastia.   She was seen for initial consult by Dr. Marla Roe.  At that time, patient complained of back pain and neck pain.  She reported that holding her breast that manually would provide some relief.  Patient also reported that activities such as exercise and running were hindered by her enlarged breast.  On exam, her STN on the right was 34 cm and her STN on the left was 34 cm.  Her BMI was 34 kg/m.  Her preoperative bra size was an E-F cup.  Patient at this visit reported that she would like to be a B cup.  The estimated amount of excess breast tissue to be removed at the time of surgery was 620-640 g bilaterally.  Patient had a mammogram in January 2023 which was negative.  Patient expressed the desire to pursue surgical intervention.  Mammogram and physical therapy was ordered for the patient.  Patient was found to be a good candidate for bilateral breast reduction with possible liposuction.  Per chart review, patient underwent mammogram on 02/18/2022.  Results show that there is no mammographic evidence of malignancy.  BI-RADS Category 1: Negative.  Today, patient reports she is doing well.  Patient states that she completed physical therapy last week.  She states that although she went to physical therapy, she is still having neck pain, shoulder pain and lower back pain.  Patient also states that her ability to exercise is limited due to her enlarged breasts.  Patient states she also has been having to wear 2 sports bras and also has been having to sleep on her side due to the size of her breasts.  Patient expresses the desire to move forward with bilateral breast reduction.   Patient also states that she has an  E-F cup.  She states that she would like to be a B cup.  Review of Systems General: No changes in recent health MSK: Endorses ongoing back and neck discomfort  Physical Exam    01/14/2022   10:17 AM 12/25/2021    9:09 AM 12/03/2021    8:14 AM  Vitals with BMI  Height 5\' 4"  5\' 4"  5\' 4"   Weight 191 lbs 4 oz 198 lbs 3 oz 198 lbs 13 oz  BMI 24.23 34 53.61  Systolic 443 154 008  Diastolic 80 96 82  Pulse 54 67 55    General:  No acute distress,  Alert and oriented, Non-Toxic, Normal speech and affect Psych: Normal behavior and mood Respiratory: No increased WOB MSK: Ambulatory  Assessment/Plan  Symptomatic mammary hypertrophy   Patient is interested in pursuing surgical intervention for bilateral breast reduction. Patient has completed at least 6 weeks of physical therapy for pain related to macromastia.  Discussed with patient we would submit to insurance for authorization, discussed approval could take up to 6 weeks.   I discussed the limitations of how much tissue can be removed during surgery with the patient and that cup size cannot be guaranteed.  Patient expressed understanding.  I instructed the patient to call back in the meantime if she has any questions or concerns about anything.   Clance Boll 02/24/2022, 9:06 AM

## 2022-02-24 NOTE — Telephone Encounter (Signed)
Patient called and and asked if Dr. Marla Roe sent a referral out for a chiropractor. I did inform her that Dr. Marla Roe had a referral sent out for physical therapy. Patient asked if Dr. Marla Roe was also going to do a referral for a chiropractor, If so, she has picked out one, [facility] phone [phone] fax [phone] is the infor patient gave me.

## 2022-02-25 ENCOUNTER — Ambulatory Visit: Payer: 59 | Admitting: Student

## 2022-02-25 VITALS — Wt 188.4 lb

## 2022-02-25 DIAGNOSIS — M545 Low back pain, unspecified: Secondary | ICD-10-CM

## 2022-02-25 DIAGNOSIS — M542 Cervicalgia: Secondary | ICD-10-CM | POA: Diagnosis not present

## 2022-02-25 DIAGNOSIS — N62 Hypertrophy of breast: Secondary | ICD-10-CM

## 2022-02-25 DIAGNOSIS — M25511 Pain in right shoulder: Secondary | ICD-10-CM | POA: Diagnosis not present

## 2022-02-25 DIAGNOSIS — M25512 Pain in left shoulder: Secondary | ICD-10-CM

## 2022-02-26 NOTE — Telephone Encounter (Signed)
MyChart message sent to pt

## 2022-03-02 ENCOUNTER — Telehealth: Payer: Self-pay | Admitting: *Deleted

## 2022-03-02 ENCOUNTER — Encounter: Payer: Self-pay | Admitting: Plastic Surgery

## 2022-03-02 ENCOUNTER — Encounter: Payer: Self-pay | Admitting: Nurse Practitioner

## 2022-03-02 NOTE — Telephone Encounter (Signed)
Denial received, per [employer] Scale w/ BSA of 1.97 885g of tissue are required to be removed, our notes indicate 620 - 640g.  Patient notified

## 2022-07-16 ENCOUNTER — Ambulatory Visit: Payer: 59 | Admitting: Nurse Practitioner

## 2022-07-16 ENCOUNTER — Other Ambulatory Visit: Payer: Self-pay | Admitting: Nurse Practitioner

## 2022-07-16 ENCOUNTER — Encounter: Payer: Self-pay | Admitting: Plastic Surgery

## 2022-07-16 VITALS — BP 118/60 | HR 47 | Temp 98.3°F | Ht 64.0 in | Wt 182.0 lb

## 2022-07-16 DIAGNOSIS — E669 Obesity, unspecified: Secondary | ICD-10-CM | POA: Diagnosis not present

## 2022-07-16 DIAGNOSIS — Z6832 Body mass index (BMI) 32.0-32.9, adult: Secondary | ICD-10-CM

## 2022-07-16 DIAGNOSIS — R7303 Prediabetes: Secondary | ICD-10-CM | POA: Diagnosis not present

## 2022-07-16 DIAGNOSIS — E039 Hypothyroidism, unspecified: Secondary | ICD-10-CM | POA: Diagnosis not present

## 2022-07-16 DIAGNOSIS — R001 Bradycardia, unspecified: Secondary | ICD-10-CM

## 2022-07-16 DIAGNOSIS — E785 Hyperlipidemia, unspecified: Secondary | ICD-10-CM | POA: Diagnosis not present

## 2022-07-16 LAB — COMPREHENSIVE METABOLIC PANEL
ALT: 14 U/L (ref 0–35)
AST: 19 U/L (ref 0–37)
Albumin: 4.2 g/dL (ref 3.5–5.2)
Alkaline Phosphatase: 64 U/L (ref 39–117)
BUN: 12 mg/dL (ref 6–23)
CO2: 25 mEq/L (ref 19–32)
Calcium: 9.5 mg/dL (ref 8.4–10.5)
Chloride: 105 mEq/L (ref 96–112)
Creatinine, Ser: 0.77 mg/dL (ref 0.40–1.20)
GFR: 91.61 mL/min (ref >60.00)
Glucose, Bld: 88 mg/dL (ref 70–99)
Potassium: 4.3 mEq/L (ref 3.5–5.1)
Sodium: 138 mEq/L (ref 135–145)
Total Bilirubin: 0.7 mg/dL (ref 0.2–1.2)
Total Protein: 7 g/dL (ref 6.0–8.3)

## 2022-07-16 LAB — HEMOGLOBIN A1C: Hgb A1c MFr Bld: 5.6 % (ref 4.6–6.5)

## 2022-07-16 LAB — LIPID PANEL
Cholesterol: 196 mg/dL (ref 0–200)
HDL: 43.2 mg/dL (ref >39.00)
LDL Cholesterol: 139 mg/dL — ABNORMAL HIGH (ref 0–99)
NonHDL: 152.91
Total CHOL/HDL Ratio: 5
Triglycerides: 72 mg/dL (ref 0.0–149.0)
VLDL: 14.4 mg/dL (ref 0.0–40.0)

## 2022-07-16 LAB — TSH: TSH: 0.15 u[IU]/mL — ABNORMAL LOW (ref 0.35–5.50)

## 2022-07-16 MED ORDER — METFORMIN HCL 500 MG/5ML PO SOLN: 5.0000 mL | Freq: Every day | ORAL | 1 refills | Status: DC

## 2022-07-16 MED ORDER — SYNTHROID 137 MCG PO TABS
137.0000 ug | ORAL_TABLET | Freq: Every day | ORAL | 1 refills | Status: DC
Start: 2022-07-16 — End: 2022-12-02

## 2022-07-16 NOTE — Assessment & Plan Note (Signed)
Chronic Check A1c today Start on metformin 500 mg by mouth daily with breakfast, patient requesting liquid form

## 2022-07-16 NOTE — Assessment & Plan Note (Signed)
Incidental finding, patient frequently has mildly bradycardic heart rates.  She is asymptomatic and exercises regularly.  Heart rate regular on exam today.  Patient courage to let me know if she starts to experience any chest pain, palpitations, fatigue, exertional fatigue, shortness of breath at which point further evaluation would be recommended.

## 2022-07-16 NOTE — Assessment & Plan Note (Signed)
Chronic Check fasting lipid panel today, further recommendations may be made based on the results

## 2022-07-16 NOTE — Assessment & Plan Note (Signed)
Chronic Current BMI 31.2 Encouraged her on her weight loss thus far We had a long shared decision-making conversation and have decided to treat with metformin for the off label treatment of prediabetes and obesity.  She reports that she has had this in the past and did not tolerate the pills due to their size.  She would prefer liquid form.  Metformin 500 mg/5 mL sent to patient's pharmacy today.  She will start by taking 500 mg once a day with breakfast.

## 2022-07-16 NOTE — Progress Notes (Signed)
Established Patient Office Visit  Subjective   Patient ID: Rebecca Meza, female    DOB: [birth year]  Age: 47 y.o. MRN: [id number]  Chief Complaint  Patient presents with   Medical Management of Chronic Issues    6 month follow and weight management ( medication)    Obesity/HLD/Prediabetes/hypothyroidism: Patient has to follow-up today.  She has been actively trying to lose weight, with a goal weight under 180.  She would like to discuss possible pharmacological treatment to assist her with this weight loss.  Patient is not on anything for hyperlipidemia, last LDL was 160 but ASCVD risk score is 1.4%.  She also has prediabetes with last A1c of 5.7.  She continues on Synthroid 150 mcg daily.    Review of Systems  Constitutional:  Negative for malaise/fatigue.  Respiratory:  Negative for shortness of breath.   Cardiovascular:  Negative for chest pain and palpitations.      Objective:     BP 118/60   Pulse (!) 47   Temp 98.3 F (36.8 C) (Temporal)   Ht 5\' 4"  (1.626 m)   Wt 182 lb (82.6 kg)   SpO2 98%   BMI 31.24 kg/m  BP Readings from Last 3 Encounters:  07/16/22 118/60  01/14/22 134/80  12/25/21 (!) 163/96   Wt Readings from Last 3 Encounters:  07/16/22 182 lb (82.6 kg)  02/25/22 188 lb 6.4 oz (85.5 kg)  01/14/22 191 lb 4 oz (86.8 kg)      Physical Exam Vitals reviewed.  Constitutional:      General: She is not in acute distress.    Appearance: Normal appearance.  HENT:     Head: Normocephalic and atraumatic.  Neck:     Vascular: No carotid bruit.  Cardiovascular:     Rate and Rhythm: Normal rate and regular rhythm.     Pulses: Normal pulses.     Heart sounds: Normal heart sounds.  Pulmonary:     Effort: Pulmonary effort is normal.     Breath sounds: Normal breath sounds.  Skin:    General: Skin is warm and dry.  Neurological:     General: No focal deficit present.     Mental Status: She is alert and oriented to person, place, and time.   Psychiatric:        Mood and Affect: Mood normal.        Behavior: Behavior normal.        Judgment: Judgment normal.      No results found for any visits on 07/16/22.    The 10-year ASCVD risk score (Arnett DK, et al., 2019) is: 1.4%    Assessment & Plan:   Problem List Items Addressed This Visit       Endocrine   Hypothyroidism    Chronic Check TSH, further recommendations may be made based on these results      Relevant Orders   TSH     Other   Hyperlipidemia - Primary    Chronic Check fasting lipid panel today, further recommendations may be made based on the results      Relevant Orders   Comprehensive metabolic panel   Lipid panel   Prediabetes    Chronic Check A1c today Start on metformin 500 mg by mouth daily with breakfast, patient requesting liquid form      Relevant Medications   Metformin HCl 500 MG/5ML SOLN   Other Relevant Orders   Hemoglobin A1c   Obesity    Chronic Current  BMI 31.2 Encouraged her on her weight loss thus far We had a long shared decision-making conversation and have decided to treat with metformin for the off label treatment of prediabetes and obesity.  She reports that she has had this in the past and did not tolerate the pills due to their size.  She would prefer liquid form.  Metformin 500 mg/5 mL sent to patient's pharmacy today.  She will start by taking 500 mg once a day with breakfast.      Relevant Medications   Metformin HCl 500 MG/5ML SOLN   Bradycardia    Incidental finding, patient frequently has mildly bradycardic heart rates.  She is asymptomatic and exercises regularly.  Heart rate regular on exam today.  Patient courage to let me know if she starts to experience any chest pain, palpitations, fatigue, exertional fatigue, shortness of breath at which point further evaluation would be recommended.       Return in about 6 months (around 01/20/2023) for CPE with Maralyn Sago.    Elenore Paddy, NP

## 2022-07-16 NOTE — Assessment & Plan Note (Signed)
Chronic Check TSH, further recommendations may be made based on these results

## 2022-08-24 ENCOUNTER — Encounter: Payer: Self-pay | Admitting: Plastic Surgery

## 2022-08-24 ENCOUNTER — Ambulatory Visit: Payer: 59 | Admitting: Plastic Surgery

## 2022-08-24 VITALS — BP 130/80 | HR 49 | Ht 64.25 in | Wt 178.0 lb

## 2022-08-24 DIAGNOSIS — N6489 Other specified disorders of breast: Secondary | ICD-10-CM

## 2022-08-24 DIAGNOSIS — M25512 Pain in left shoulder: Secondary | ICD-10-CM

## 2022-08-24 DIAGNOSIS — M542 Cervicalgia: Secondary | ICD-10-CM | POA: Diagnosis not present

## 2022-08-24 DIAGNOSIS — G8929 Other chronic pain: Secondary | ICD-10-CM

## 2022-08-24 DIAGNOSIS — M546 Pain in thoracic spine: Secondary | ICD-10-CM | POA: Diagnosis not present

## 2022-08-24 DIAGNOSIS — M25511 Pain in right shoulder: Secondary | ICD-10-CM | POA: Diagnosis not present

## 2022-08-24 DIAGNOSIS — N62 Hypertrophy of breast: Secondary | ICD-10-CM | POA: Diagnosis not present

## 2022-08-24 DIAGNOSIS — Z6830 Body mass index (BMI) 30.0-30.9, adult: Secondary | ICD-10-CM

## 2022-08-24 DIAGNOSIS — R21 Rash and other nonspecific skin eruption: Secondary | ICD-10-CM

## 2022-08-24 NOTE — Progress Notes (Signed)
Subjective:    Patient ID: Rebecca Meza, female    DOB: [birth year], 47 y.o.   MRN: [id number]  The patient is a 47 year old female here for follow-up on her breast.  The patient complains of constant pain and discomfort in her shoulders neck and back area.  She has tried conservative treatment with sports bras and padding.  The weight loss has not improved her breast size.  She gets rashes that she has to use cream and powder for in her skin folds.  She is 5 feet 4 inches tall and weighs 178 pounds.  Her BMI is 30.6 kg/m.  The sternal notch to nipple distance is 30 cm on both sides.  Her preoperative bra size is E or F cup and she would like to be a B cup size.  The estimated amount of tissue to be removed at the time of the surgery is 600 g from both sides.  Her mammogram is up to date.    Review of Systems  Constitutional: Negative.   HENT: Negative.    Eyes: Negative.   Respiratory: Negative.  Negative for chest tightness and shortness of breath.   Gastrointestinal: Negative.   Endocrine: Negative.   Genitourinary: Negative.   Musculoskeletal: Negative.        Objective:   Physical Exam Vitals and nursing note reviewed.  Constitutional:      Appearance: Normal appearance.  HENT:     Head: Normocephalic and atraumatic.  Cardiovascular:     Rate and Rhythm: Normal rate.     Pulses: Normal pulses.  Pulmonary:     Effort: Pulmonary effort is normal.  Abdominal:     General: There is no distension.     Palpations: Abdomen is soft.     Tenderness: There is no abdominal tenderness.  Skin:    General: Skin is warm.     Capillary Refill: Capillary refill takes less than 2 seconds.  Neurological:     Mental Status: She is alert and oriented to person, place, and time.  Psychiatric:        Mood and Affect: Mood normal.        Behavior: Behavior normal.        Thought Content: Thought content normal.        Judgment: Judgment normal.       Assessment & Plan:      ICD-10-CM   1. Pendulous breast  N64.89     2. Symptomatic mammary hypertrophy  N62     3. Chronic bilateral thoracic back pain  M54.6    G89.29     4. Neck pain  M54.2       The procedure the patient selected and that was best for the patient was discussed. The risk were discussed and include but not limited to the following:  Breast asymmetry, fluid accumulation, firmness of the breast, inability to breast feed, loss of nipple or areola, skin loss, change in skin and nipple sensation, fat necrosis of the breast tissue, bleeding, infection and healing delay.  There are risks of anesthesia and injury to nerves or blood vessels.  Allergic reaction to tape, suture and skin glue are possible.  There will be swelling.  Any of these can lead to the need for revisional surgery which is not included in this surgery.  A breast reduction has potential to interfere with diagnostic procedures in the future.  This procedure is best done when the breast is fully developed.  Changes in the  breast will continue to occur over time: pregnancy, weight gain or weigh loss. No guarantees are given for a certain bra or breast size.    Total time: 40 minutes. This includes time spent with the patient during the visit as well as time spent before and after the visit reviewing the chart, documenting the encounter, ordering pertinent studies and literature for the patient.    Patient is a good candidate for bilateral breast reduction with possible liposuction.  Due to her weight loss I think that she will do very well with the surgery.  Plan for bilateral breast reduction with liposuction.  Pictures were obtained of the patient and placed in the chart with the patient's or guardian's permission.

## 2022-11-19 ENCOUNTER — Ambulatory Visit (INDEPENDENT_AMBULATORY_CARE_PROVIDER_SITE_OTHER): Payer: 59 | Admitting: Surgical

## 2022-11-19 VITALS — BP 161/80 | HR 61

## 2022-11-19 DIAGNOSIS — N62 Hypertrophy of breast: Secondary | ICD-10-CM

## 2022-11-19 MED ORDER — ONDANSETRON HCL 4 MG PO TABS: 4.0000 mg | ORAL_TABLET | Freq: Three times a day (TID) | ORAL | 0 refills | Status: DC | PRN

## 2022-11-19 MED ORDER — OXYCODONE HCL 5 MG PO TABS
5.0000 mg | ORAL_TABLET | Freq: Four times a day (QID) | ORAL | 0 refills | Status: AC | PRN
Start: 2022-11-19 — End: 2022-11-24

## 2022-11-19 MED ORDER — CEPHALEXIN 500 MG PO CAPS: 500.0000 mg | ORAL_CAPSULE | Freq: Four times a day (QID) | ORAL | 0 refills | Status: AC

## 2022-11-19 NOTE — Progress Notes (Signed)
Patient ID: Rebecca Meza, female    DOB: 05-08-1975, 47 y.o.   MRN: [id number]  Chief Complaint  Patient presents with   Pre-op Exam      ICD-10-CM   1. Symptomatic mammary hypertrophy  N62       History of Present Illness: Rebecca Meza is a 47 y.o.  female  with a history of macromastia.  She presents for preoperative evaluation for upcoming procedure, bilateral breast reduction with possible liposuction, scheduled for 12/08/2022 with Dr. Ulice Bold.  The patient has not had problems with anesthesia. No history of DVT/PE.  No family history of DVT/PE.  No family or personal history of bleeding or clotting disorders.  Patient is not currently taking any blood thinners.  No history of CVA/MI.   She is using an estradiol topical plant based cream, she reports it helps with her eczema. She is willing to hold this prior to and after surgery.  Summary of Previous Visit: She was seen for consult by Dr. Ulice Bold on 08/24/2022.  At that time, complained of chronic upper back and neck discomfort in the context of large breasts.  BMI equals 30.6 kg/m.  STN 30 cm each side.  Preoperative bra size equals E or F cup and she expressed that she would like to be as small as a B cup postoperatively.  Estimated excess tissue to be removed at time of surgery equals 600 g each side.  Discussed breast reduction surgery and patient was agreeable to proceed.  Job: Not currently working.  PMH Significant for: Macromastia, thyroid disorder on Synthroid. Hx of DM, no longer taking metformin, recent A1c 5.6.  She would like to be as small as possible.   Past Medical History: Allergies: Allergies  Allergen Reactions   Gluten Meal Other (See Comments)    Current Medications:  Current Outpatient Medications:    Albuterol Sulfate (PROAIR RESPICLICK) 108 (90 Base) MCG/ACT AEPB, Inhale 2 puffs into the lungs every 6 (six) hours as needed., Disp: 1 each, Rfl: 2   Metformin HCl 500 MG/5ML SOLN, Take  5 mLs (500 mg total) by mouth daily with breakfast., Disp: 473 mL, Rfl: 1   SYNTHROID 137 MCG tablet, Take 1 tablet (137 mcg total) by mouth daily before breakfast., Disp: 90 tablet, Rfl: 1  Past Medical Problems: Past Medical History:  Diagnosis Date   Allergy    oral antihistamine; seasonal.   Anxiety    Depression    Thyroid disease 01/19/2003   hypothyroidism    Past Surgical History: Past Surgical History:  Procedure Laterality Date   TONSILLECTOMY     uterine ablation      Social History: Social History   Socioeconomic History   Marital status: Married    Spouse name: Not on file   Number of children: Not on file   Years of education: Not on file   Highest education level: Bachelor's degree (e.g., BA, AB, BS)  Occupational History   Not on file  Tobacco Use   Smoking status: Never   Smokeless tobacco: Never  Substance and Sexual Activity   Alcohol use: No   Drug use: No   Sexual activity: Yes    Birth control/protection: Condom  Other Topics Concern   Not on file  Social History Narrative   Marital status: married x 12 years; happily married; no abuse; moved from [place] in 2014.      Children two children/sons (11, 6)      Lives: with husband,  two children/sons.      Employment:  Homemaker      Tobacco: none      Alcohol:  Socially; 2-3 glasses of wine.      Drugs: none      Exercise:   Swimming. Sporadic.       Seatbelt:  100%; no texting      Guns:  None      Sunscreen: SPF 50-70   Social Determinants of Health   Financial Resource Strain: Low Risk  (07/12/2022)   Overall Financial Resource Strain (CARDIA)    Difficulty of Paying Living Expenses: Not hard at all  Food Insecurity: No Food Insecurity (07/12/2022)   Hunger Vital Sign    Worried About Running Out of Food in the Last Year: Never true    Ran Out of Food in the Last Year: Never true  Transportation Needs: No Transportation Needs (07/12/2022)   PRAPARE - Scientist, research (physical sciences) (Medical): No    Lack of Transportation (Non-Medical): No  Physical Activity: Sufficiently Active (07/12/2022)   Exercise Vital Sign    Days of Exercise per Week: 5 days    Minutes of Exercise per Session: 90 min  Stress: No Stress Concern Present (07/12/2022)   [employer] of Occupational Health - Occupational Stress Questionnaire    Feeling of Stress : Only a little  Social Connections: Unknown (07/12/2022)   Social Connection and Isolation Panel [NHANES]    Frequency of Communication with Friends and Family: More than three times a week    Frequency of Social Gatherings with Friends and Family: Once a week    Attends Religious Services: Patient declined    Database administrator or Organizations: No    Attends Engineer, structural: Not on file    Marital Status: Married  Catering manager Violence: Not on file    Family History: Family History  Problem Relation Age of Onset   Cancer Father 88       pancreatic cancer   Hypothyroidism Mother    Breast cancer Cousin     Review of Systems: ROS  Physical Exam: Vital Signs BP (!) 161/80 (BP Location: Left Arm, Patient Position: Sitting, Cuff Size: Large) Comment: caught in accident before visit-will recheck  Pulse 61   SpO2 98%   Physical Exam Constitutional:      General: Not in acute distress.    Appearance: Normal appearance. Not ill-appearing.  HENT:     Head: Normocephalic and atraumatic.  Eyes:     Pupils: Pupils are equal, round. Cardiovascular:     Rate and Rhythm: Normal rate.    Pulses: Normal pulses.  Pulmonary:     Effort: No respiratory distress or increased work of breathing.  Speaks in full sentences. Abdominal:     General: Abdomen is flat. No distension.   Musculoskeletal: Normal range of motion. No lower extremity swelling or edema. No varicosities. Skin:    General: Skin is warm and dry.     Findings: No erythema or rash.  Neurological:     Mental Status: Alert and  oriented to person, place, and time.  Psychiatric:        Mood and Affect: Mood normal.        Behavior: Behavior normal.    Assessment/Plan: The patient is scheduled for bilateral breast reduction with possible liposuction with Dr. Ulice Bold.  Risks, benefits, and alternatives of procedure discussed, questions answered and consent obtained.    Smoking Status: Non smoker;  Counseling Given? N/a Last Mammogram: 02/2022; Results: BI-RADS Category 1: Negative.  Caprini Score: 4, moderate; Risk Factors include: age, BMI > 25, and length of planned surgery. Recommendation for mechanical prophylaxis. Encourage early ambulation.   Pictures obtained: 08/24/2022  Post-op Rx sent to pharmacy: Zofran, keflex, oxy 5mg   Patient was provided with the General Surgical Risk consent document and Pain Medication Agreement prior to their appointment.  They had adequate time to read through the risk consent documents and Pain Medication Agreement. We also discussed them in person together during this preop appointment. All of their questions were answered to their satisfaction.  Recommended calling if they have any further questions.  Risk consent form and Pain Medication Agreement to be scanned into patient's chart.  The risk that can be encountered with breast reduction were discussed and include the following but not limited to these:  Breast asymmetry, fluid accumulation, firmness of the breast, inability to breast feed, loss of nipple or areola, skin loss, decrease or no nipple sensation, fat necrosis of the breast tissue, bleeding, infection, healing delay.  There are risks of anesthesia, changes to skin sensation and injury to nerves or blood vessels.  The muscle can be temporarily or permanently injured.  You may have an allergic reaction to tape, suture, glue, blood products which can result in skin discoloration, swelling, pain, skin lesions, poor healing.  Any of these can lead to the need for revisonal  surgery or stage procedures.  A reduction has potential to interfere with diagnostic procedures.  Nipple or breast piercing can increase risks of infection.  This procedure is best done when the breast is fully developed.  Changes in the breast will continue to occur over time.  Pregnancy can alter the outcomes of previous breast reduction surgery, weight gain and weigh loss can also effect the long term appearance.   The risks that can be encountered with and after liposuction were discussed and include the following but no limited to these:  Asymmetry, fluid accumulation, firmness of the area, fat necrosis with death of fat tissue, bleeding, infection, delayed healing, anesthesia risks, skin sensation changes, injury to structures including nerves, blood vessels, and muscles which may be temporary or permanent, allergies to tape, suture materials and glues, blood products, topical preparations or injected agents, skin and contour irregularities, skin discoloration and swelling, deep vein thrombosis, cardiac and pulmonary complications, pain, which may persist, persistent pain, recurrence of the lesion, poor healing of the incision, possible need for revisional surgery or staged procedures. Thiere can also be persistent swelling, poor wound healing, rippling or loose skin, worsening of cellulite, swelling, and thermal burn or heat injury from ultrasound with the ultrasound-assisted lipoplasty technique. Any change in weight fluctuations can alter the outcome.   Electronically signed by: Kermit Balo Albaraa Swingle, PA-C 11/19/2022 10:11 AM

## 2022-11-19 NOTE — H&P (View-Only) (Signed)
 Patient ID: Rebecca Meza, female    DOB: 05-08-1975, 47 y.o.   MRN: [id number]  Chief Complaint  Patient presents with   Pre-op Exam      ICD-10-CM   1. Symptomatic mammary hypertrophy  N62       History of Present Illness: Rebecca Meza is a 47 y.o.  female  with a history of macromastia.  She presents for preoperative evaluation for upcoming procedure, bilateral breast reduction with possible liposuction, scheduled for 12/08/2022 with Dr. Ulice Bold.  The patient has not had problems with anesthesia. No history of DVT/PE.  No family history of DVT/PE.  No family or personal history of bleeding or clotting disorders.  Patient is not currently taking any blood thinners.  No history of CVA/MI.   She is using an estradiol topical plant based cream, she reports it helps with her eczema. She is willing to hold this prior to and after surgery.  Summary of Previous Visit: She was seen for consult by Dr. Ulice Bold on 08/24/2022.  At that time, complained of chronic upper back and neck discomfort in the context of large breasts.  BMI equals 30.6 kg/m.  STN 30 cm each side.  Preoperative bra size equals E or F cup and she expressed that she would like to be as small as a B cup postoperatively.  Estimated excess tissue to be removed at time of surgery equals 600 g each side.  Discussed breast reduction surgery and patient was agreeable to proceed.  Job: Not currently working.  PMH Significant for: Macromastia, thyroid disorder on Synthroid. Hx of DM, no longer taking metformin, recent A1c 5.6.  She would like to be as small as possible.   Past Medical History: Allergies: Allergies  Allergen Reactions   Gluten Meal Other (See Comments)    Current Medications:  Current Outpatient Medications:    Albuterol Sulfate (PROAIR RESPICLICK) 108 (90 Base) MCG/ACT AEPB, Inhale 2 puffs into the lungs every 6 (six) hours as needed., Disp: 1 each, Rfl: 2   Metformin HCl 500 MG/5ML SOLN, Take  5 mLs (500 mg total) by mouth daily with breakfast., Disp: 473 mL, Rfl: 1   SYNTHROID 137 MCG tablet, Take 1 tablet (137 mcg total) by mouth daily before breakfast., Disp: 90 tablet, Rfl: 1  Past Medical Problems: Past Medical History:  Diagnosis Date   Allergy    oral antihistamine; seasonal.   Anxiety    Depression    Thyroid disease 01/19/2003   hypothyroidism    Past Surgical History: Past Surgical History:  Procedure Laterality Date   TONSILLECTOMY     uterine ablation      Social History: Social History   Socioeconomic History   Marital status: Married    Spouse name: Not on file   Number of children: Not on file   Years of education: Not on file   Highest education level: Bachelor's degree (e.g., BA, AB, BS)  Occupational History   Not on file  Tobacco Use   Smoking status: Never   Smokeless tobacco: Never  Substance and Sexual Activity   Alcohol use: No   Drug use: No   Sexual activity: Yes    Birth control/protection: Condom  Other Topics Concern   Not on file  Social History Narrative   Marital status: married x 12 years; happily married; no abuse; moved from [place] in 2014.      Children two children/sons (11, 6)      Lives: with husband,  two children/sons.      Employment:  Homemaker      Tobacco: none      Alcohol:  Socially; 2-3 glasses of wine.      Drugs: none      Exercise:   Swimming. Sporadic.       Seatbelt:  100%; no texting      Guns:  None      Sunscreen: SPF 50-70   Social Determinants of Health   Financial Resource Strain: Low Risk  (07/12/2022)   Overall Financial Resource Strain (CARDIA)    Difficulty of Paying Living Expenses: Not hard at all  Food Insecurity: No Food Insecurity (07/12/2022)   Hunger Vital Sign    Worried About Running Out of Food in the Last Year: Never true    Ran Out of Food in the Last Year: Never true  Transportation Needs: No Transportation Needs (07/12/2022)   PRAPARE - Scientist, research (physical sciences) (Medical): No    Lack of Transportation (Non-Medical): No  Physical Activity: Sufficiently Active (07/12/2022)   Exercise Vital Sign    Days of Exercise per Week: 5 days    Minutes of Exercise per Session: 90 min  Stress: No Stress Concern Present (07/12/2022)   [employer] of Occupational Health - Occupational Stress Questionnaire    Feeling of Stress : Only a little  Social Connections: Unknown (07/12/2022)   Social Connection and Isolation Panel [NHANES]    Frequency of Communication with Friends and Family: More than three times a week    Frequency of Social Gatherings with Friends and Family: Once a week    Attends Religious Services: Patient declined    Database administrator or Organizations: No    Attends Engineer, structural: Not on file    Marital Status: Married  Catering manager Violence: Not on file    Family History: Family History  Problem Relation Age of Onset   Cancer Father 88       pancreatic cancer   Hypothyroidism Mother    Breast cancer Cousin     Review of Systems: ROS  Physical Exam: Vital Signs BP (!) 161/80 (BP Location: Left Arm, Patient Position: Sitting, Cuff Size: Large) Comment: caught in accident before visit-will recheck  Pulse 61   SpO2 98%   Physical Exam Constitutional:      General: Not in acute distress.    Appearance: Normal appearance. Not ill-appearing.  HENT:     Head: Normocephalic and atraumatic.  Eyes:     Pupils: Pupils are equal, round. Cardiovascular:     Rate and Rhythm: Normal rate.    Pulses: Normal pulses.  Pulmonary:     Effort: No respiratory distress or increased work of breathing.  Speaks in full sentences. Abdominal:     General: Abdomen is flat. No distension.   Musculoskeletal: Normal range of motion. No lower extremity swelling or edema. No varicosities. Skin:    General: Skin is warm and dry.     Findings: No erythema or rash.  Neurological:     Mental Status: Alert and  oriented to person, place, and time.  Psychiatric:        Mood and Affect: Mood normal.        Behavior: Behavior normal.    Assessment/Plan: The patient is scheduled for bilateral breast reduction with possible liposuction with Dr. Ulice Bold.  Risks, benefits, and alternatives of procedure discussed, questions answered and consent obtained.    Smoking Status: Non smoker;  Counseling Given? N/a Last Mammogram: 02/2022; Results: BI-RADS Category 1: Negative.  Caprini Score: 4, moderate; Risk Factors include: age, BMI > 25, and length of planned surgery. Recommendation for mechanical prophylaxis. Encourage early ambulation.   Pictures obtained: 08/24/2022  Post-op Rx sent to pharmacy: Zofran, keflex, oxy 5mg   Patient was provided with the General Surgical Risk consent document and Pain Medication Agreement prior to their appointment.  They had adequate time to read through the risk consent documents and Pain Medication Agreement. We also discussed them in person together during this preop appointment. All of their questions were answered to their satisfaction.  Recommended calling if they have any further questions.  Risk consent form and Pain Medication Agreement to be scanned into patient's chart.  The risk that can be encountered with breast reduction were discussed and include the following but not limited to these:  Breast asymmetry, fluid accumulation, firmness of the breast, inability to breast feed, loss of nipple or areola, skin loss, decrease or no nipple sensation, fat necrosis of the breast tissue, bleeding, infection, healing delay.  There are risks of anesthesia, changes to skin sensation and injury to nerves or blood vessels.  The muscle can be temporarily or permanently injured.  You may have an allergic reaction to tape, suture, glue, blood products which can result in skin discoloration, swelling, pain, skin lesions, poor healing.  Any of these can lead to the need for revisonal  surgery or stage procedures.  A reduction has potential to interfere with diagnostic procedures.  Nipple or breast piercing can increase risks of infection.  This procedure is best done when the breast is fully developed.  Changes in the breast will continue to occur over time.  Pregnancy can alter the outcomes of previous breast reduction surgery, weight gain and weigh loss can also effect the long term appearance.   The risks that can be encountered with and after liposuction were discussed and include the following but no limited to these:  Asymmetry, fluid accumulation, firmness of the area, fat necrosis with death of fat tissue, bleeding, infection, delayed healing, anesthesia risks, skin sensation changes, injury to structures including nerves, blood vessels, and muscles which may be temporary or permanent, allergies to tape, suture materials and glues, blood products, topical preparations or injected agents, skin and contour irregularities, skin discoloration and swelling, deep vein thrombosis, cardiac and pulmonary complications, pain, which may persist, persistent pain, recurrence of the lesion, poor healing of the incision, possible need for revisional surgery or staged procedures. Thiere can also be persistent swelling, poor wound healing, rippling or loose skin, worsening of cellulite, swelling, and thermal burn or heat injury from ultrasound with the ultrasound-assisted lipoplasty technique. Any change in weight fluctuations can alter the outcome.   Electronically signed by: Kermit Balo Albaraa Swingle, PA-C 11/19/2022 10:11 AM

## 2022-11-30 ENCOUNTER — Other Ambulatory Visit: Payer: Self-pay

## 2022-11-30 ENCOUNTER — Encounter (HOSPITAL_BASED_OUTPATIENT_CLINIC_OR_DEPARTMENT_OTHER): Payer: Self-pay | Admitting: Plastic Surgery

## 2022-12-01 ENCOUNTER — Encounter: Payer: Self-pay | Admitting: Internal Medicine

## 2022-12-01 ENCOUNTER — Ambulatory Visit: Payer: 59 | Admitting: Internal Medicine

## 2022-12-01 VITALS — BP 110/66 | HR 54 | Ht 64.25 in | Wt 181.6 lb

## 2022-12-01 DIAGNOSIS — M542 Cervicalgia: Secondary | ICD-10-CM | POA: Diagnosis not present

## 2022-12-01 DIAGNOSIS — E039 Hypothyroidism, unspecified: Secondary | ICD-10-CM

## 2022-12-01 LAB — T4, FREE: Free T4: 1.31 ng/dL (ref 0.60–1.60)

## 2022-12-01 LAB — TSH: TSH: 0.8 u[IU]/mL (ref 0.35–5.50)

## 2022-12-01 NOTE — Patient Instructions (Signed)
Please stop at the lab.  Please continue Synthroid 137 mcg daily.  Take the thyroid hormone every day, with water, at least 30 minutes before breakfast, separated by at least 4 hours from: - acid reflux medications - calcium - iron - multivitamins  Please return in 1 year.

## 2022-12-01 NOTE — Progress Notes (Unsigned)
Patient ID: Rebecca Meza, female   DOB: Sep 27, 1975, 47 y.o.   MRN: [id number]  HPI  Rebecca Meza is a 47 y.o.-year-old female, returning for follow-up for hypothyroidism. Last visit 1 year ago.  Interim history: She lost a net 17 pounds since last visit, intentionally - adjusting diet, increased protein, exercise. Continues a gluten-free diet. She has some increased anxiety - related to breast reduction surgery coming up next week.  Reviewed history: Pt. has been dx with hypothyroidism in 2004; she was started on Levothyroxine (LT4) - but did not feel good (fatigued, "sluggish"); then Armour (but TFTs difficult to control), then Synthroid d.a.w. in 2005.  She is on Synthroid 137 mcg (dose decreased 06/2022) daily: - in am - fasting - at least 30 min from b'fast - no calcium - no iron - + multivitamins later in the day - no PPIs - off Biotin in B complex (75 mcg)  Off ashwagandha. On extra strength probiotics Olly - skin is better, also helps with gluten sensitivity.  Reviewed her TFTs: Lab Results  Component Value Date   TSH 0.15 (L) 07/16/2022   TSH 1.86 11/30/2021   TSH 2.75 11/28/2020   TSH 2.57 11/30/2019   TSH 2.24 10/05/2018   TSH 7.67 (H) 09/28/2017   TSH 4.45 03/29/2017   TSH 0.06 (L) 09/28/2016   TSH 0.50 03/29/2016   TSH 0.45 09/29/2015   FREET4 1.14 11/30/2021   FREET4 1.20 11/28/2020   FREET4 1.30 11/30/2019   FREET4 1.05 10/05/2018   FREET4 0.73 09/28/2017   FREET4 0.90 03/29/2017   FREET4 1.64 (H) 09/28/2016   FREET4 1.25 03/29/2016   FREET4 1.26 09/29/2015   FREET4 0.87 05/29/2015    Her TPO antibodies were not elevated: Component     Latest Ref Rng 05/29/2015  Thyroperoxidase Ab SerPl-aCnc     <9 IU/mL 3   Pt denies: - feeling nodules in neck - hoarseness - dysphagia - choking  She has + FH of thyroid disorders in: hypothyroidism in mother and possible MGM. No FH of thyroid cancer. No h/o radiation tx to head or neck. No herbal  supplements. No Biotin use. No recent steroids use.   She is seeing psychiatry (for anxiety, ADHD)-she continues on Vyvanse, Celexa. She had an endometrial ablation in 01/2020. She goes to the gym ([employer]) 4-6x a week - rowing machine + [employer]: weights and cardio with a trainer - 3 days a week + swimming in the rest of the days.  ROS: + see HPI  I reviewed pt's medications, allergies, PMH, social hx, family hx, and changes were documented in the history of present illness. Otherwise, unchanged from my initial visit note.  Past Medical History:  Diagnosis Date   Allergy    oral antihistamine; seasonal.   Anxiety    Depression    Hx of sinus bradycardia    Pre-diabetes    Thyroid disease 01/19/2003   hypothyroidism   Past Surgical History:  Procedure Laterality Date   TONSILLECTOMY     uterine ablation     Social History   Social History Main Topics   Smoking status: Never Smoker    Smokeless tobacco: Never Used   Alcohol Use: No   Drug Use: No   Sexual Activity: Yes    Birth Control/ Protection: Condom   Social History Narrative   Marital status: married; no abuse; moved from [place] in 2014.      Children two children (8,3)  Lives: with husband, two children.      Employment:  Homemaker      Tobacco: none      Alcohol:  Socially      Drugs: none      Exercise:  Running every other day 2 miles alternating with 3 miles.       Seatbelt:  100%      Guns:  None      Sunscreen: SPF 50-70   Current Outpatient Medications on File Prior to Visit  Medication Sig Dispense Refill   Albuterol Sulfate (PROAIR RESPICLICK) 108 (90 Base) MCG/ACT AEPB Inhale 2 puffs into the lungs every 6 (six) hours as needed. 1 each 2   Metformin HCl 500 MG/5ML SOLN Take 5 mLs (500 mg total) by mouth daily with breakfast. 473 mL 1   ondansetron (ZOFRAN) 4 MG tablet Take 1 tablet (4 mg total) by mouth every 8 (eight) hours as needed for nausea or vomiting. 20 tablet 0   SYNTHROID 137 MCG  tablet Take 1 tablet (137 mcg total) by mouth daily before breakfast. 90 tablet 1   No current facility-administered medications on file prior to visit.   Allergies  Allergen Reactions   Gluten Meal Other (See Comments)   Family History  Problem Relation Age of Onset   Cancer Father 28       pancreatic cancer   Hypothyroidism Mother    Breast cancer Cousin    PE: BP 110/66   Pulse (!) 54   Ht 5' 4.25" (1.632 m)   Wt 181 lb 9.6 oz (82.4 kg)   SpO2 99%   BMI 30.93 kg/m  Wt Readings from Last 10 Encounters:  12/01/22 181 lb 9.6 oz (82.4 kg)  08/24/22 178 lb (80.7 kg)  07/16/22 182 lb (82.6 kg)  02/25/22 188 lb 6.4 oz (85.5 kg)  01/14/22 191 lb 4 oz (86.8 kg)  12/25/21 198 lb 3.2 oz (89.9 kg)  12/03/21 198 lb 12.8 oz (90.2 kg)  11/30/21 197 lb 12.8 oz (89.7 kg)  11/28/20 199 lb 12.8 oz (90.6 kg)  11/30/19 194 lb 12.8 oz (88.4 kg)   Constitutional: overweight, in NAD Eyes:  EOMI, no exophthalmos ENT: no neck masses, no cervical lymphadenopathy Cardiovascular: RRR, No MRG Respiratory: CTA B Musculoskeletal: no deformities Skin:no rashes Neurological: no tremor with outstretched hands  ASSESSMENT: 1. Hypothyroidism - acquired  2.  Neck pressure/discomfort  PLAN:  1. Patient with longstanding hypothyroidism, on brand-name Synthroid therapy - her thyroid is improved after moving levothyroxine from bedtime to morning - latest thyroid labs reviewed with pt. >> normal: Lab Results  Component Value Date   TSH 0.15 (L) 07/16/2022  - she continues on Synthroid d.a.w. 137 mcg daily, dose decreased 06/2022 by PCP - pt feels good on this dose.  She lost 17 pounds since last visit, intentionally.  We discussed that this is likely the reason why she started to require less levothyroxine. - we discussed about taking the thyroid hormone every day, with water, >30 minutes before breakfast, separated by >4 hours from acid reflux medications, calcium, iron, multivitamins. Pt. is  taking it correctly. - will check thyroid tests today: TSH and fT4 - If labs are abnormal, she will need to return for repeat TFTs in 1.5 months  2.  Neck pressure/discomfort -She previously described neck pressure/discomfort, possibly attributed to inflammation in the setting of Hashimoto's thyroiditis.  She noticed that this exacerbated after stopping gluten, which is very unusual... -However, this is now resolved except  for globus sensation related to anxiety related to her upcoming surgery next week -I do not feel thyroid enlargement or other masses felt on palpation of her neck today -Will continue to keep an eye on this but no imaging or intervention is needed for now  Needs refills.  Carlus Pavlov, MD PhD [facility] Endocrinology

## 2022-12-02 MED ORDER — SYNTHROID 137 MCG PO TABS: 137.0000 ug | ORAL_TABLET | Freq: Every day | ORAL | 3 refills | Status: DC

## 2022-12-06 ENCOUNTER — Encounter (HOSPITAL_BASED_OUTPATIENT_CLINIC_OR_DEPARTMENT_OTHER)
Admission: RE | Admit: 2022-12-06 | Discharge: 2022-12-06 | Disposition: A | Discharge status: Home / Self Care | Payer: 59 | Source: Ambulatory Visit | Attending: Plastic Surgery | Admitting: Plastic Surgery

## 2022-12-06 DIAGNOSIS — Z01812 Encounter for preprocedural laboratory examination: Secondary | ICD-10-CM | POA: Diagnosis present

## 2022-12-06 LAB — BASIC METABOLIC PANEL
Anion gap: 5 (ref 5–15)
BUN: 13 mg/dL (ref 6–20)
CO2: 24 mmol/L (ref 22–32)
Calcium: 9.1 mg/dL (ref 8.9–10.3)
Chloride: 108 mmol/L (ref 98–111)
Creatinine, Ser: 1.2 mg/dL — ABNORMAL HIGH (ref 0.44–1.00)
GFR, Estimated: 56 mL/min — ABNORMAL LOW (ref >60)
Glucose, Bld: 95 mg/dL (ref 70–99)
Potassium: 4.3 mmol/L (ref 3.5–5.1)
Sodium: 137 mmol/L (ref 135–145)

## 2022-12-06 MED ORDER — CHLORHEXIDINE GLUCONATE CLOTH 2 % EX PADS: 6.0000 | MEDICATED_PAD | Freq: Once | CUTANEOUS | Status: DC

## 2022-12-08 ENCOUNTER — Encounter (HOSPITAL_BASED_OUTPATIENT_CLINIC_OR_DEPARTMENT_OTHER): Payer: Self-pay | Admitting: Plastic Surgery

## 2022-12-08 ENCOUNTER — Other Ambulatory Visit: Payer: Self-pay

## 2022-12-08 ENCOUNTER — Ambulatory Visit (HOSPITAL_BASED_OUTPATIENT_CLINIC_OR_DEPARTMENT_OTHER): Payer: 59 | Admitting: Anesthesiology

## 2022-12-08 ENCOUNTER — Ambulatory Visit (HOSPITAL_BASED_OUTPATIENT_CLINIC_OR_DEPARTMENT_OTHER)
Admission: RE | Admit: 2022-12-08 | Discharge: 2022-12-08 | Disposition: A | Discharge status: Home / Self Care | Payer: 59 | Attending: Plastic Surgery | Admitting: Plastic Surgery

## 2022-12-08 ENCOUNTER — Encounter (HOSPITAL_BASED_OUTPATIENT_CLINIC_OR_DEPARTMENT_OTHER)
Admission: RE | Disposition: A | Discharge status: Home / Self Care | Payer: Self-pay | Source: Home / Self Care | Attending: Plastic Surgery

## 2022-12-08 DIAGNOSIS — N62 Hypertrophy of breast: Secondary | ICD-10-CM

## 2022-12-08 DIAGNOSIS — Z01818 Encounter for other preprocedural examination: Secondary | ICD-10-CM

## 2022-12-08 DIAGNOSIS — E039 Hypothyroidism, unspecified: Secondary | ICD-10-CM | POA: Insufficient documentation

## 2022-12-08 DIAGNOSIS — R7303 Prediabetes: Secondary | ICD-10-CM

## 2022-12-08 HISTORY — DX: Unspecified asthma, uncomplicated: J45.909

## 2022-12-08 HISTORY — DX: Personal history of other diseases of the circulatory system: Z86.79

## 2022-12-08 HISTORY — DX: Hypothyroidism, unspecified: E03.9

## 2022-12-08 HISTORY — DX: Prediabetes: R73.03

## 2022-12-08 HISTORY — PX: BREAST REDUCTION SURGERY: SHX8

## 2022-12-08 LAB — GLUCOSE, CAPILLARY
Glucose-Capillary: 90 mg/dL (ref 70–99)
Glucose-Capillary: 102 mg/dL — ABNORMAL HIGH (ref 70–99)

## 2022-12-08 LAB — POCT PREGNANCY, URINE: Preg Test, Ur: NEGATIVE

## 2022-12-08 SURGERY — MAMMOPLASTY, REDUCTION
Anesthesia: General | Site: Breast | Laterality: Bilateral

## 2022-12-08 MED ORDER — CEFAZOLIN SODIUM-DEXTROSE 2-4 GM/100ML-% IV SOLN
2.0000 g | INTRAVENOUS | Status: AC
Start: 2022-12-08 — End: 2022-12-08
  Administered 2022-12-08: 2 g via INTRAVENOUS

## 2022-12-08 MED ORDER — DEXAMETHASONE SODIUM PHOSPHATE 10 MG/ML IJ SOLN
INTRAMUSCULAR | Status: AC
  Filled 2022-12-08: qty 1

## 2022-12-08 MED ORDER — OXYCODONE HCL 5 MG PO TABS: 5.0000 mg | ORAL_TABLET | ORAL | Status: DC | PRN

## 2022-12-08 MED ORDER — HYDROMORPHONE HCL 1 MG/ML IJ SOLN
0.2500 mg | INTRAMUSCULAR | Status: DC | PRN
  Administered 2022-12-08: 0.5 mg via INTRAVENOUS

## 2022-12-08 MED ORDER — HYDROMORPHONE HCL 1 MG/ML IJ SOLN
INTRAMUSCULAR | Status: AC
  Filled 2022-12-08: qty 0.5

## 2022-12-08 MED ORDER — OXYCODONE HCL 5 MG PO TABS
5.0000 mg | ORAL_TABLET | Freq: Once | ORAL | Status: AC | PRN
  Administered 2022-12-08: 5 mg via ORAL

## 2022-12-08 MED ORDER — PHENYLEPHRINE 80 MCG/ML (10ML) SYRINGE FOR IV PUSH (FOR BLOOD PRESSURE SUPPORT)
PREFILLED_SYRINGE | INTRAVENOUS | Status: AC
Start: 2022-12-08 — End: ?
  Filled 2022-12-08: qty 10

## 2022-12-08 MED ORDER — MIDAZOLAM HCL 5 MG/5ML IJ SOLN
INTRAMUSCULAR | Status: DC | PRN
  Administered 2022-12-08: 2 mg via INTRAVENOUS

## 2022-12-08 MED ORDER — EPHEDRINE 5 MG/ML INJ
INTRAVENOUS | Status: AC
  Filled 2022-12-08: qty 5

## 2022-12-08 MED ORDER — ACETAMINOPHEN 325 MG PO TABS: 650.0000 mg | ORAL_TABLET | ORAL | Status: DC | PRN

## 2022-12-08 MED ORDER — ONDANSETRON HCL 4 MG/2ML IJ SOLN
INTRAMUSCULAR | Status: AC
Start: 2022-12-08 — End: ?
  Filled 2022-12-08: qty 2

## 2022-12-08 MED ORDER — FENTANYL CITRATE (PF) 100 MCG/2ML IJ SOLN
INTRAMUSCULAR | Status: AC
  Filled 2022-12-08: qty 2

## 2022-12-08 MED ORDER — NITROGLYCERIN 2 % TD OINT
TOPICAL_OINTMENT | TRANSDERMAL | Status: AC
  Filled 2022-12-08: qty 30

## 2022-12-08 MED ORDER — FENTANYL CITRATE (PF) 100 MCG/2ML IJ SOLN
INTRAMUSCULAR | Status: DC | PRN
  Administered 2022-12-08 (×4): 50 ug via INTRAVENOUS

## 2022-12-08 MED ORDER — PROPOFOL 10 MG/ML IV BOLUS
INTRAVENOUS | Status: DC | PRN
  Administered 2022-12-08: 160 mg via INTRAVENOUS

## 2022-12-08 MED ORDER — ACETAMINOPHEN 325 MG RE SUPP: 650.0000 mg | RECTAL | Status: DC | PRN

## 2022-12-08 MED ORDER — SODIUM CHLORIDE 0.9% FLUSH: 3.0000 mL | INTRAVENOUS | Status: DC | PRN

## 2022-12-08 MED ORDER — SUCCINYLCHOLINE CHLORIDE 200 MG/10ML IV SOSY
PREFILLED_SYRINGE | INTRAVENOUS | Status: AC
  Filled 2022-12-08: qty 10

## 2022-12-08 MED ORDER — OXYCODONE HCL 5 MG/5ML PO SOLN: 5.0000 mg | Freq: Once | ORAL | Status: AC | PRN

## 2022-12-08 MED ORDER — LIDOCAINE-EPINEPHRINE 1 %-1:100000 IJ SOLN
INTRAMUSCULAR | Status: DC | PRN
  Administered 2022-12-08: 48 mL

## 2022-12-08 MED ORDER — GABAPENTIN 300 MG PO CAPS
300.0000 mg | ORAL_CAPSULE | Freq: Once | ORAL | Status: AC
Start: 2022-12-08 — End: 2022-12-08
  Administered 2022-12-08: 300 mg via ORAL

## 2022-12-08 MED ORDER — ACETAMINOPHEN 500 MG PO TABS
ORAL_TABLET | ORAL | Status: AC
  Filled 2022-12-08: qty 2

## 2022-12-08 MED ORDER — NITROGLYCERIN 2 % TD OINT
TOPICAL_OINTMENT | TRANSDERMAL | Status: DC | PRN
  Administered 2022-12-08: .5 [in_us] via TOPICAL

## 2022-12-08 MED ORDER — GLYCOPYRROLATE 0.2 MG/ML IJ SOLN
INTRAMUSCULAR | Status: DC | PRN
  Administered 2022-12-08: .2 mg via INTRAVENOUS

## 2022-12-08 MED ORDER — GLYCOPYRROLATE PF 0.2 MG/ML IJ SOSY
PREFILLED_SYRINGE | INTRAMUSCULAR | Status: AC
  Filled 2022-12-08: qty 1

## 2022-12-08 MED ORDER — MIDAZOLAM HCL 2 MG/2ML IJ SOLN
INTRAMUSCULAR | Status: AC
  Filled 2022-12-08: qty 2

## 2022-12-08 MED ORDER — OXYCODONE HCL 5 MG PO TABS
ORAL_TABLET | ORAL | Status: AC
  Filled 2022-12-08: qty 1

## 2022-12-08 MED ORDER — DROPERIDOL 2.5 MG/ML IJ SOLN
0.6250 mg | Freq: Once | INTRAMUSCULAR | Status: DC | PRN
Start: 2022-12-08 — End: 2022-12-08

## 2022-12-08 MED ORDER — GABAPENTIN 300 MG PO CAPS
ORAL_CAPSULE | ORAL | Status: AC
  Filled 2022-12-08: qty 1

## 2022-12-08 MED ORDER — SODIUM CHLORIDE 0.9 % IV SOLN: 250.0000 mL | INTRAVENOUS | Status: DC | PRN

## 2022-12-08 MED ORDER — SODIUM CHLORIDE 0.9 % IV SOLN
INTRAVENOUS | Status: DC | PRN
  Administered 2022-12-08: 40 mL

## 2022-12-08 MED ORDER — LIDOCAINE 2% (20 MG/ML) 5 ML SYRINGE
INTRAMUSCULAR | Status: AC
Start: 2022-12-08 — End: ?
  Filled 2022-12-08: qty 5

## 2022-12-08 MED ORDER — BUPIVACAINE LIPOSOME 1.3 % IJ SUSP
INTRAMUSCULAR | Status: AC
  Filled 2022-12-08: qty 20

## 2022-12-08 MED ORDER — LIDOCAINE HCL (CARDIAC) PF 100 MG/5ML IV SOSY
PREFILLED_SYRINGE | INTRAVENOUS | Status: DC | PRN
  Administered 2022-12-08: 80 mg via INTRAVENOUS

## 2022-12-08 MED ORDER — CEFAZOLIN SODIUM-DEXTROSE 2-4 GM/100ML-% IV SOLN
INTRAVENOUS | Status: AC
  Filled 2022-12-08: qty 100

## 2022-12-08 MED ORDER — ACETAMINOPHEN 500 MG PO TABS
1000.0000 mg | ORAL_TABLET | Freq: Once | ORAL | Status: AC
  Administered 2022-12-08: 1000 mg via ORAL

## 2022-12-08 MED ORDER — FENTANYL CITRATE (PF) 100 MCG/2ML IJ SOLN: 25.0000 ug | INTRAMUSCULAR | Status: DC | PRN

## 2022-12-08 MED ORDER — DEXAMETHASONE SODIUM PHOSPHATE 4 MG/ML IJ SOLN
INTRAMUSCULAR | Status: DC | PRN
  Administered 2022-12-08: 5 mg via INTRAVENOUS

## 2022-12-08 MED ORDER — VASHE WOUND IRRIGATION OPTIME
TOPICAL | Status: DC | PRN
  Administered 2022-12-08: 34 [oz_av]

## 2022-12-08 MED ORDER — SODIUM CHLORIDE 0.9% FLUSH: 3.0000 mL | Freq: Two times a day (BID) | INTRAVENOUS | Status: DC

## 2022-12-08 MED ORDER — DROPERIDOL 2.5 MG/ML IJ SOLN
INTRAMUSCULAR | Status: DC | PRN
Start: 2022-12-08 — End: 2022-12-08
  Administered 2022-12-08: .625 mg via INTRAVENOUS

## 2022-12-08 MED ORDER — LACTATED RINGERS IV SOLN: INTRAVENOUS | Status: DC

## 2022-12-08 MED ORDER — HYDROMORPHONE HCL 1 MG/ML IJ SOLN
INTRAMUSCULAR | Status: DC | PRN
  Administered 2022-12-08 (×2): .5 mg via INTRAVENOUS

## 2022-12-08 SURGICAL SUPPLY — 62 items
BAG DECANTER FOR FLEXI CONT (MISCELLANEOUS) ×1 IMPLANT
BINDER BREAST LRG (GAUZE/BANDAGES/DRESSINGS) IMPLANT
BINDER BREAST MEDIUM (GAUZE/BANDAGES/DRESSINGS) IMPLANT
BINDER BREAST XLRG (GAUZE/BANDAGES/DRESSINGS) IMPLANT
BINDER BREAST XXLRG (GAUZE/BANDAGES/DRESSINGS) IMPLANT
BIOPATCH RED 1 DISK 7.0 (GAUZE/BANDAGES/DRESSINGS) IMPLANT
BLADE HEX COATED 2.75 (ELECTRODE) IMPLANT
BLADE KNIFE PERSONA 10 (BLADE) ×2 IMPLANT
BLADE SURG 15 STRL LF DISP TIS (BLADE) ×1 IMPLANT
CANISTER SUCT 1200ML W/VALVE (MISCELLANEOUS) ×1 IMPLANT
CLEANSER WND VASHE 34 (WOUND CARE) IMPLANT
COLLAGEN CELLERATERX 5 GRAM (Miscellaneous) IMPLANT
COVER BACK TABLE 60X90IN (DRAPES) ×1 IMPLANT
COVER MAYO STAND STRL (DRAPES) ×1 IMPLANT
DERMABOND ADVANCED .7 DNX12 (GAUZE/BANDAGES/DRESSINGS) ×2 IMPLANT
DRAIN CHANNEL 19F RND (DRAIN) IMPLANT
DRAPE LAPAROSCOPIC ABDOMINAL (DRAPES) ×1 IMPLANT
DRAPE UTILITY XL STRL (DRAPES) ×1 IMPLANT
DRSG MEPILEX POST OP 4X8 (GAUZE/BANDAGES/DRESSINGS) ×2 IMPLANT
ELECT BLADE 4.0 EZ CLEAN MEGAD (MISCELLANEOUS) ×1
ELECT REM PT RETURN 9FT ADLT (ELECTROSURGICAL) ×1
ELECTRODE BLDE 4.0 EZ CLN MEGD (MISCELLANEOUS) ×1 IMPLANT
ELECTRODE REM PT RTRN 9FT ADLT (ELECTROSURGICAL) ×1 IMPLANT
EVACUATOR SILICONE 100CC (DRAIN) IMPLANT
GAUZE PAD ABD 8X10 STRL (GAUZE/BANDAGES/DRESSINGS) ×2 IMPLANT
GLOVE BIO SURGEON STRL SZ 6.5 (GLOVE) ×3 IMPLANT
GLOVE BIO SURGEON STRL SZ7.5 (GLOVE) ×1 IMPLANT
GLOVE BIOGEL PI IND STRL 7.0 (GLOVE) IMPLANT
GLOVE BIOGEL PI IND STRL 8 (GLOVE) IMPLANT
GOWN STRL REUS W/ TWL LRG LVL3 (GOWN DISPOSABLE) ×2 IMPLANT
GOWN STRL REUS W/ TWL XL LVL3 (GOWN DISPOSABLE) ×1 IMPLANT
NDL FILTER BLUNT 18X1 1/2 (NEEDLE) IMPLANT
NDL HYPO 25X1 1.5 SAFETY (NEEDLE) ×2 IMPLANT
NEEDLE FILTER BLUNT 18X1 1/2 (NEEDLE)
NEEDLE HYPO 25X1 1.5 SAFETY (NEEDLE) ×2
NS IRRIG 1000ML POUR BTL (IV SOLUTION) ×1 IMPLANT
PACK BASIN DAY SURGERY FS (CUSTOM PROCEDURE TRAY) ×1 IMPLANT
PAD ALCOHOL SWAB (MISCELLANEOUS) IMPLANT
PAD FOAM SILICONE BACKED (GAUZE/BANDAGES/DRESSINGS) IMPLANT
PENCIL SMOKE EVACUATOR (MISCELLANEOUS) ×1 IMPLANT
PIN SAFETY STERILE (MISCELLANEOUS) IMPLANT
SLEEVE SCD COMPRESS KNEE MED (STOCKING) ×1 IMPLANT
SPIKE FLUID TRANSFER (MISCELLANEOUS) IMPLANT
SPONGE T-LAP 18X18 ~~LOC~~+RFID (SPONGE) ×2 IMPLANT
STRIP SUTURE WOUND CLOSURE 1/2 (MISCELLANEOUS) ×2 IMPLANT
SUT MNCRL AB 4-0 PS2 18 (SUTURE) ×4 IMPLANT
SUT MON AB 3-0 SH27 (SUTURE) ×4 IMPLANT
SUT MON AB 5-0 PS2 18 (SUTURE) IMPLANT
SUT PDS 3-0 CT2 (SUTURE) ×5
SUT PDS II 3-0 CT2 27 ABS (SUTURE) ×4 IMPLANT
SUT SILK 3 0 PS 1 (SUTURE) IMPLANT
SYR 50ML LL SCALE MARK (SYRINGE) IMPLANT
SYR BULB IRRIG 60ML STRL (SYRINGE) ×1 IMPLANT
SYR CONTROL 10ML LL (SYRINGE) ×2 IMPLANT
TAPE MEASURE VINYL STERILE (MISCELLANEOUS) IMPLANT
TOWEL GREEN STERILE FF (TOWEL DISPOSABLE) ×3 IMPLANT
TRAY DSU PREP LF (CUSTOM PROCEDURE TRAY) ×1 IMPLANT
TUBE CONNECTING 20X1/4 (TUBING) ×1 IMPLANT
TUBING INFILTRATION IT-10001 (TUBING) IMPLANT
TUBING SET GRADUATE ASPIR 12FT (MISCELLANEOUS) IMPLANT
UNDERPAD 30X36 HEAVY ABSORB (UNDERPADS AND DIAPERS) ×2 IMPLANT
YANKAUER SUCT BULB TIP NO VENT (SUCTIONS) ×1 IMPLANT

## 2022-12-08 NOTE — Op Note (Addendum)
Breast Reduction Op note:    DATE OF PROCEDURE: 12/08/2022  LOCATION: [facility]  SURGEON: Foster Simpson, DO  ASSISTANT: Evelena Leyden, PA  PREOPERATIVE DIAGNOSIS 1. Macromastia 2. Neck Pain 3. Back Pain  POSTOPERATIVE DIAGNOSIS 1. Macromastia 2. Neck Pain 3. Back Pain  PROCEDURES 1. Bilateral breast reduction.  Right reduction 592 g, Left reduction 610 g  COMPLICATIONS: None.  DRAINS: none  INDICATIONS FOR PROCEDURE Rebecca Meza is a 47 y.o. year-old female born on May 03, 1975,with a history of symptomatic macromastia with concominant back pain, neck pain, shoulder grooving from her bra.   MRN: [id number]  CONSENT Informed consent was obtained directly from the patient. The risks, benefits and alternatives were fully discussed. Specific risks including but not limited to bleeding, infection, hematoma, seroma, scarring, pain, nipple necrosis, asymmetry, poor cosmetic results, and need for further surgery were discussed. The patient's questions were answered.  DESCRIPTION OF PROCEDURE  Patient was brought into the operating room and rested on the operating room table in the supine position.  SCDs were placed and appropriate padding was performed.  Antibiotics were given. The patient underwent general anesthesia and the chest was prepped and draped in a sterile fashion.  A timeout was performed and all information was confirmed to be correct by those in the room.  Right side: Preoperative markings were confirmed.  Incision lines were injected with local containing epinephrine.  After waiting for vasoconstriction, the marked lines were incised with a #15 blade.  A Wise-pattern superomedial breast reduction was performed by de-epithelializing the pedicle, using bovie to create the superomedial pedicle, and removing breast tissue from the superior, lateral, and inferior portions of the breast.  Care was taken to not undermine the breast pedicle.  Hemostasis was achieved.  The nipple was gently rotated into position and the soft tissue closed with 4-0 Monocryl.   The pocket was irrigated and hemostasis confirmed.  The deep tissues were approximated with 3-0 PDS sutures.  The skin was closed with deep dermal 3-0 Monocryl and subcuticular 4-0 Monocryl sutures.  The skin flaps had good capillary refill at the end of the procedure.  Nitro was placed on the right nipple precautionary.  Left side: Preoperative markings were confirmed.  Incision lines were injected with local containing epinephrine.  After waiting for vasoconstriction, the marked lines were incised with a #15 blade.  A Wise-pattern superomedial breast reduction was performed by de-epithelializing the pedicle, using bovie to create the superomedial pedicle, and removing breast tissue from the superior, lateral, and inferior portions of the breast.  Care was taken to not undermine the breast pedicle. Hemostasis was achieved.  The nipple was gently rotated into position and the soft tissue was closed with 4-0 Monocryl.  The patient was sat upright and size and shape symmetry was confirmed.  The pocket was irrigated and hemostasis confirmed.  Cellerate and experel were placed in each pocket prior to closure. The deep tissues were approximated with 3-0 PDS sutures. The skin was closed with deep dermal 3-0 Monocryl and subcuticular 4-0 Monocryl sutures.  Dermabond was applied.  A breast binder and ABDs were placed.  The nipple and skin flaps had good capillary refill at the end of the procedure.  The patient tolerated the procedure well. The patient was allowed to wake from anesthesia and taken to the recovery room in satisfactory condition.  The advanced practice practitioner (APP) assisted throughout the case.  The APP was essential in retraction and counter traction when needed to make  the case progress smoothly.  This retraction and assistance made it possible to see the tissue plans for the  procedure.  The assistance was needed for blood control, tissue re-approximation and assisted with closure of the incision site.

## 2022-12-08 NOTE — Interval H&P Note (Signed)
History and Physical Interval Note:  12/08/2022 12:06 PM  Rebecca Meza  has presented today for surgery, with the diagnosis of macromastia.  The various methods of treatment have been discussed with the patient and family. After consideration of risks, benefits and other options for treatment, the patient has consented to  Procedure(s): BREAST REDUCTION WITH LIPOSUCTION (Bilateral) as a surgical intervention.  The patient's history has been reviewed, patient examined, no change in status, stable for surgery.  I have reviewed the patient's chart and labs.  Questions were answered to the patient's satisfaction.     [name] [name]

## 2022-12-08 NOTE — Discharge Instructions (Addendum)
INSTRUCTIONS FOR AFTER BREAST SURGERY   You will likely have some questions about what to expect following your operation.  The following information will help you and your family understand what to expect when you are discharged from the hospital.  It is important to follow these guidelines to help ensure a smooth recovery and reduce complication.  Postoperative instructions include information on: diet, wound care, medications and physical activity.  AFTER SURGERY Expect to go home after the procedure.  In some cases, you may need to spend one night in the hospital for observation.  DIET Breast surgery does not require a specific diet.  However, the healthier you eat the better your body will heal. It is important to increasing your protein intake.  This means limiting the foods with sugar and carbohydrates.  Focus on vegetables and some meat.  If you have liposuction during your procedure be sure to drink water.  If your urine is bright yellow, then it is concentrated, and you need to drink more water.  As a general rule after surgery, you should have 8 ounces of water every hour while awake.  If you find you are persistently nauseated or unable to take in liquids let us know.  NO TOBACCO USE or EXPOSURE.  This will slow your healing process and lead to a wound.  WOUND CARE Leave the binder on for 3 days . Use fragrance free soap like Dial, Dove or [place].   After 3 days you can remove the binder to shower. Once dry apply binder or sports bra. If you have liposuction you will have a soft and spongy dressing (Lipofoam) that helps prevent creases in your skin.  Remove before you shower and then replace it.  It is also available on [employer]. If you have steri-strips / tape directly attached to your skin leave them in place. It is OK to get these wet.   No baths, pools or hot tubs for four weeks. We close your incision to leave the smallest and best-looking scar. No ointment or creams on your incisions  for four weeks.  No Neosporin (Too many skin reactions).  A few weeks after surgery you can use Mederma and start massaging the scar. We ask you to wear your binder or sports bra for the first 6 weeks around the clock, including while sleeping. This provides added comfort and helps reduce the fluid accumulation at the surgery site. NO Ice or heating pads to the operative site.  You have a very high risk of a BURN before you feel the temperature change. Apply 1/4" Nitro Paste to the right nipple/areola TWICE daily x3 days.  ACTIVITY No heavy lifting until cleared by the doctor.  This usually means no more than a half-gallon of milk.  It is OK to walk and climb stairs. Moving your legs is very important to decrease your risk of a blood clot.  It will also help keep you from getting deconditioned.  Every 1 to 2 hours get up and walk for 5 minutes. This will help with a quicker recovery back to normal.  Let pain be your guide so you don't do too much.  This time is for you to recover.  You will be more comfortable if you sleep and rest with your head elevated either with a few pillows under you or in a recliner.  No stomach sleeping for a three months.  WORK Everyone returns to work at different times. As a rough guide, most people take at  least 1 - 2 weeks off prior to returning to work. If you need documentation for your job, give the forms to the front staff at the clinic.  DRIVING Arrange for someone to bring you home from the hospital after your surgery.  You may be able to drive a few days after surgery but not while taking any narcotics or valium.  BOWEL MOVEMENTS Constipation can occur after anesthesia and while taking pain medication.  It is important to stay ahead for your comfort.  We recommend taking Milk of Magnesia (2 tablespoons; twice a day) while taking the pain pills.  MEDICATIONS You may be prescribed should start after surgery At your preoperative visit for you history and physical  you may have been given the following medications: An antibiotic: Start this medication when you get home and take according to the instructions on the bottle. Zofran 4 mg:  This is to treat nausea and vomiting.  You can take this every 6 hours as needed and only if needed. Valium 2 mg for breast cancer patients: This is for muscle tightness if you have an implant or expander. This will help relax your muscle which also helps with pain control.  This can be taken every 12 hours as needed. Don't drive after taking this medication. Norco (hydrocodone/acetaminophen) 5/325 mg:  This is only to be used after you have taken the Motrin or the Tylenol. Every 8 hours as needed.   Over the counter Medication to take: Ibuprofen (Motrin) 600 mg:  Take this every 6 hours.  If you have additional pain then take 500 mg of the Tylenol every 8 hours.  Only take the Norco after you have tried these two. MiraLAX or Milk of Magnesia: Take this according to the bottle if you take the Norco.  WHEN TO CALL Call your surgeon's office if any of the following occur: Fever 101 degrees F or greater Excessive bleeding or fluid from the incision site. Pain that increases over time without aid from the medications Redness, warmth, or pus draining from incision sites Persistent nausea or inability to take in liquids Severe misshapen area that underwent the operation.   Post Anesthesia Home Care Instructions  Activity: Get plenty of rest for the remainder of the day. A responsible individual must stay with you for 24 hours following the procedure.  For the next 24 hours, DO NOT: -Drive a car -Advertising copywriter -Drink alcoholic beverages -Take any medication unless instructed by your physician -Make any legal decisions or sign important papers.  Meals: Start with liquid foods such as gelatin or soup. Progress to regular foods as tolerated. Avoid greasy, spicy, heavy foods. If nausea and/or vomiting occur, drink only  clear liquids until the nausea and/or vomiting subsides. Call your physician if vomiting continues.  Special Instructions/Symptoms: Your throat may feel dry or sore from the anesthesia or the breathing tube placed in your throat during surgery. If this causes discomfort, gargle with warm salt water. The discomfort should disappear within 24 hours.  If you had a scopolamine patch placed behind your ear for the management of post- operative nausea and/or vomiting:  1. The medication in the patch is effective for 72 hours, after which it should be removed.  Wrap patch in a tissue and discard in the trash. Wash hands thoroughly with soap and water. 2. You may remove the patch earlier than 72 hours if you experience unpleasant side effects which may include dry mouth, dizziness or visual disturbances. 3. Avoid touching the  patch. Wash your hands with soap and water after contact with the patch.   Information for Discharge Teaching:   EXPAREL (bupivacaine liposome injectable suspension)   Pain relief is important to your recovery. The goal is to control your pain so you can move easier and return to your normal activities as soon as possible after your procedure. Your physician may use several types of medicines to manage pain, swelling, and more.  Your surgeon or anesthesiologist gave you EXPAREL(bupivacaine) to help control your pain after surgery.  EXPAREL is a local anesthetic designed to release slowly over an extended period of time to provide pain relief by numbing the tissue around the surgical site. EXPAREL is designed to release pain medication over time and can control pain for up to 72 hours. Depending on how you respond to EXPAREL, you may require less pain medication during your recovery. EXPAREL can help reduce or eliminate the need for opioids during the first few days after surgery when pain relief is needed the most. EXPAREL is not an opioid and is not addictive. It does not cause  sleepiness or sedation.   Important! A teal colored band has been placed on your arm with the date, time and amount of EXPAREL you have received. Please leave this armband in place for the full 96 hours following administration, and then you may remove the band. If you return to the hospital for any reason within 96 hours following the administration of EXPAREL, the armband provides important information that your health care providers to know, and alerts them that you have received this anesthetic.    Possible side effects of EXPAREL: Temporary loss of sensation or ability to move in the area where medication was injected. Nausea, vomiting, constipation Rarely, numbness and tingling in your mouth or lips, lightheadedness, or anxiety may occur. Call your doctor right away if you think you may be experiencing any of these sensations, or if you have other questions regarding possible side effects.  Follow all other discharge instructions given to you by your surgeon or nurse. Eat a healthy diet and drink plenty of water or other fluids.  *May have Tylenol today at 6pm *You had 5mg  of Oxycodone at 4:30pm today

## 2022-12-08 NOTE — Anesthesia Procedure Notes (Signed)
Procedure Name: LMA Insertion Date/Time: 12/08/2022 12:50 PM  Performed by: Ronnette Hila, CRNAPre-anesthesia Checklist: Patient identified, Emergency Drugs available, Suction available and Patient being monitored Patient Re-evaluated:Patient Re-evaluated prior to induction Oxygen Delivery Method: Circle system utilized Preoxygenation: Pre-oxygenation with 100% oxygen Induction Type: IV induction Ventilation: Mask ventilation without difficulty LMA: LMA inserted LMA Size: 4.0 Number of attempts: 1 Airway Equipment and Method: Bite block Placement Confirmation: positive ETCO2 Tube secured with: Tape Dental Injury: Teeth and Oropharynx as per pre-operative assessment

## 2022-12-08 NOTE — Transfer of Care (Signed)
Immediate Anesthesia Transfer of Care Note  Patient: Rebecca Meza  Procedure(s) Performed: MAMMARY REDUCTION  (BREAST) (Bilateral: Breast)  Patient Location: PACU  Anesthesia Type:General  Level of Consciousness: awake, alert , oriented, drowsy, and patient cooperative  Airway & Oxygen Therapy: Patient Spontanous Breathing and Patient connected to face mask oxygen  Post-op Assessment: Report given to RN and Post -op Vital signs reviewed and stable  Post vital signs: Reviewed and stable  Last Vitals:  Vitals Value Taken Time  BP    Temp    Pulse    Resp    SpO2      Last Pain:  Vitals:   12/08/22 1113  TempSrc: Oral  PainSc: 0-No pain         Complications: No notable events documented.

## 2022-12-08 NOTE — Anesthesia Preprocedure Evaluation (Addendum)
Anesthesia Evaluation  Patient identified by MRN, date of birth, ID band Patient awake    Reviewed: Allergy & Precautions, NPO status , Patient's Chart, lab work & pertinent test results  Airway Mallampati: II  TM Distance: >3 FB Neck ROM: Full    Dental no notable dental hx. (+) Dental Advisory Given, Teeth Intact   Pulmonary asthma    Pulmonary exam normal breath sounds clear to auscultation       Cardiovascular negative cardio ROS Normal cardiovascular exam Rhythm:Regular Rate:Normal     Neuro/Psych  PSYCHIATRIC DISORDERS Anxiety Depression    negative neurological ROS     GI/Hepatic negative GI ROS, Neg liver ROS,,,  Endo/Other  Hypothyroidism    Renal/GU negative Renal ROS     Musculoskeletal negative musculoskeletal ROS (+)    Abdominal  (+) + obese  Peds  Hematology negative hematology ROS (+)   Anesthesia Other Findings   Reproductive/Obstetrics negative OB ROS                             Anesthesia Physical Anesthesia Plan  ASA: 2  Anesthesia Plan: General   Post-op Pain Management: Tylenol PO (pre-op)* and Gabapentin PO (pre-op)*   Induction: Intravenous  PONV Risk Score and Plan: 4 or greater and Ondansetron, Dexamethasone, Treatment may vary due to age or medical condition and Midazolam  Airway Management Planned: LMA and Oral ETT  Additional Equipment:   Intra-op Plan:   Post-operative Plan: Extubation in OR  Informed Consent: I have reviewed the patients History and Physical, chart, labs and discussed the procedure including the risks, benefits and alternatives for the proposed anesthesia with the patient or authorized representative who has indicated his/her understanding and acceptance.     Dental advisory given  Plan Discussed with: CRNA  Anesthesia Plan Comments:        Anesthesia Quick Evaluation

## 2022-12-09 ENCOUNTER — Encounter (HOSPITAL_BASED_OUTPATIENT_CLINIC_OR_DEPARTMENT_OTHER): Payer: Self-pay | Admitting: Plastic Surgery

## 2022-12-09 NOTE — Progress Notes (Signed)
Patient is a pleasant 47 year old female s/p bilateral breast reduction performed 12/08/2022 by Dr. Ulice Bold who joins via telephone for postoperative day 2 check-in.  Nitroglycerin paste was used after the surgery on the right NAC given duskiness intraoperatively, likely associated with local anesthesia with epinephrine placed adjunct to the area.  The case otherwise went as anticipated, approximately 600 g removed from each side.  Today, patient is doing well from postoperative standpoint.  She is understandably sore, but no severe pain.  She denies any leg swelling, chest pain, difficulty breathing, or fevers.  She is tolerating p.o. intake, voiding.  Patient reports that the right areola has pinked up nicely and is looking symmetric with the left areola.  She had been applying the Nitropaste, as instructed.  She does however report some itching and what appears to be early tape irritation from the bordered Mepilex dressings that were placed bilaterally.  Informed patient that she can take antihistamines to help with any itching symptoms, but she can also remove the bandages if needed.  Given that they are silicone bordered, they do not typically cause tape dermatitis, but if she removes them she just has to be careful about keeping the Steri-Strips relatively dry or else they may come off prematurely.  Patient sounds as though she is doing quite well, but can call the office should she develop any questions or concerns at any time.  She voices understanding and is agreeable to the plan.

## 2022-12-09 NOTE — Anesthesia Postprocedure Evaluation (Signed)
Anesthesia Post Note  Patient: Rebecca Meza  Procedure(s) Performed: MAMMARY REDUCTION  (BREAST) (Bilateral: Breast)     Patient location during evaluation: PACU Anesthesia Type: General Level of consciousness: sedated and patient cooperative Pain management: pain level controlled Vital Signs Assessment: post-procedure vital signs reviewed and stable Respiratory status: spontaneous breathing Cardiovascular status: stable Anesthetic complications: no   No notable events documented.  Last Vitals:  Vitals:   12/08/22 1600 12/08/22 1632  BP:  137/77  Pulse: (!) 45 67  Resp: 16 12  Temp:  37.1 C  SpO2: 95% 97%    Last Pain:  Vitals:   12/09/22 0933  TempSrc:   PainSc: 4                  Lewie Loron

## 2022-12-10 ENCOUNTER — Ambulatory Visit (INDEPENDENT_AMBULATORY_CARE_PROVIDER_SITE_OTHER): Payer: 59 | Admitting: Physician Assistant

## 2022-12-10 DIAGNOSIS — Z9889 Other specified postprocedural states: Secondary | ICD-10-CM

## 2022-12-10 LAB — SURGICAL PATHOLOGY

## 2022-12-14 ENCOUNTER — Encounter: Payer: Self-pay | Admitting: Plastic Surgery

## 2022-12-14 ENCOUNTER — Ambulatory Visit (INDEPENDENT_AMBULATORY_CARE_PROVIDER_SITE_OTHER): Payer: 59 | Admitting: Plastic Surgery

## 2022-12-14 DIAGNOSIS — N62 Hypertrophy of breast: Secondary | ICD-10-CM

## 2022-12-14 NOTE — Progress Notes (Signed)
The patient is a 48 year old female here for follow-up after undergoing bilateral breast reduction.  She had 592 g removed from the right breast and 610 g removed from the left breast on November 20.  She is doing very well.  I went ahead and remove the dressings.  There is no sign of infection or drainage.  She can go into a sports bra.  I would like to see her back in 1 to 2 weeks.  Patient states her pains been very well-controlled and there are no issues with her bowels.

## 2022-12-28 ENCOUNTER — Ambulatory Visit: Payer: 59 | Admitting: Physician Assistant

## 2022-12-28 ENCOUNTER — Encounter: Payer: Self-pay | Admitting: Physician Assistant

## 2022-12-28 VITALS — BP 159/83 | HR 53

## 2022-12-28 DIAGNOSIS — Z9889 Other specified postprocedural states: Secondary | ICD-10-CM

## 2022-12-28 NOTE — Progress Notes (Signed)
Patient is a pleasant 47 year old female s/p bilateral breast reduction performed 12/08/2022 by Dr. Ulice Bold who presents to clinic for postoperative follow-up.  She was seen in my clinic for postoperative follow-up 12/14/2022.  At that time, exam was entirely benign.  She was doing well.  Follow-up as scheduled.  Today, patient is doing relatively well from a postoperative standpoint.  Patient is bothered by some sutures that she is hoping can be removed here in clinic today.  She also has some Steri-Strips that are starting to fall off underneath her breast.  She denies any chest pain, difficulty breathing, leg swelling, fevers, or other concerns.  Patient reports that her low back pain that she experienced prior to breast reduction surgery is completely alleviated.  She is overall pleased with her outcome.  On exam, breasts have good shape and symmetry.  NAC's are healthy and viable.  Steri-Strips are all removed without complication or difficulty.  Scattered sutures are also removed.  Incisions CDI throughout.  Breasts are relatively soft throughout and there is no obvious asymmetric swelling.  No palpable seromas or hematomas.  She is doing well from a postoperative standpoint.  Recommend continued activity modifications and compressive garments.  Follow-up as scheduled.  Will discuss silicone scar gels at next encounter.  Will also defer photos until that time.

## 2023-01-06 ENCOUNTER — Ambulatory Visit: Payer: 59 | Admitting: Nurse Practitioner

## 2023-01-06 ENCOUNTER — Encounter: Payer: Self-pay | Admitting: Nurse Practitioner

## 2023-01-06 VITALS — BP 110/80 | HR 50 | Temp 97.8°F | Wt 180.5 lb

## 2023-01-06 DIAGNOSIS — Z0000 Encounter for general adult medical examination without abnormal findings: Secondary | ICD-10-CM

## 2023-01-06 DIAGNOSIS — R7303 Prediabetes: Secondary | ICD-10-CM

## 2023-01-06 DIAGNOSIS — E66811 Obesity, class 1: Secondary | ICD-10-CM | POA: Diagnosis not present

## 2023-01-06 DIAGNOSIS — Z1211 Encounter for screening for malignant neoplasm of colon: Secondary | ICD-10-CM

## 2023-01-06 DIAGNOSIS — Z0001 Encounter for general adult medical examination with abnormal findings: Secondary | ICD-10-CM

## 2023-01-06 DIAGNOSIS — Z6832 Body mass index (BMI) 32.0-32.9, adult: Secondary | ICD-10-CM | POA: Diagnosis not present

## 2023-01-06 DIAGNOSIS — E039 Hypothyroidism, unspecified: Secondary | ICD-10-CM

## 2023-01-06 LAB — LIPID PANEL
Cholesterol: 223 mg/dL — ABNORMAL HIGH (ref 0–200)
HDL: 52.7 mg/dL (ref >39.00)
LDL Cholesterol: 155 mg/dL — ABNORMAL HIGH (ref 0–99)
NonHDL: 169.87
Total CHOL/HDL Ratio: 4
Triglycerides: 74 mg/dL (ref 0.0–149.0)
VLDL: 14.8 mg/dL (ref 0.0–40.0)

## 2023-01-06 LAB — COMPREHENSIVE METABOLIC PANEL
ALT: 95 U/L — ABNORMAL HIGH (ref 0–35)
AST: 50 U/L — ABNORMAL HIGH (ref 0–37)
Albumin: 4.1 g/dL (ref 3.5–5.2)
Alkaline Phosphatase: 168 U/L — ABNORMAL HIGH (ref 39–117)
BUN: 13 mg/dL (ref 6–23)
CO2: 24 meq/L (ref 19–32)
Calcium: 9.1 mg/dL (ref 8.4–10.5)
Chloride: 104 meq/L (ref 96–112)
Creatinine, Ser: 0.87 mg/dL (ref 0.40–1.20)
GFR: 78.86 mL/min (ref >60.00)
Glucose, Bld: 90 mg/dL (ref 70–99)
Potassium: 4.4 meq/L (ref 3.5–5.1)
Sodium: 138 meq/L (ref 135–145)
Total Bilirubin: 0.6 mg/dL (ref 0.2–1.2)
Total Protein: 7 g/dL (ref 6.0–8.3)

## 2023-01-06 LAB — CBC
HCT: 42.8 % (ref 36.0–46.0)
Hemoglobin: 14 g/dL (ref 12.0–15.0)
MCHC: 32.7 g/dL (ref 30.0–36.0)
MCV: 89 fL (ref 78.0–100.0)
Platelets: 407 10*3/uL — ABNORMAL HIGH (ref 150.0–400.0)
RBC: 4.81 Mil/uL (ref 3.87–5.11)
RDW: 13.5 % (ref 11.5–15.5)
WBC: 6.7 10*3/uL (ref 4.0–10.5)

## 2023-01-06 LAB — HEMOGLOBIN A1C: Hgb A1c MFr Bld: 5.6 % (ref 4.6–6.5)

## 2023-01-06 LAB — TSH: TSH: 0.55 u[IU]/mL (ref 0.35–5.50)

## 2023-01-06 MED ORDER — METFORMIN HCL 500 MG/5ML PO SOLN: 2.5000 mL | Freq: Every day | ORAL | Status: DC

## 2023-01-06 NOTE — Assessment & Plan Note (Signed)
Chronic Check TSH, further recommendations may be made based upon his results.

## 2023-01-06 NOTE — Progress Notes (Signed)
Complete physical exam  Patient: Rebecca Meza   DOB: 1975-12-15   47 y.o. Female  MRN: [id number]  Subjective:    Chief Complaint  Patient presents with   Annual Exam    HARVEST BLEVENS is a 47 y.o. female who presents today for a complete physical exam. She reports consuming a  general  diet.  Exercise: weight lifting at gym 3-4x/week.  She generally feels well. She reports sleeping well. She does not have additional problems to discuss today.    Most recent fall risk assessment:    01/06/2023    8:11 AM  Fall Risk   Falls in the past year? 0  Number falls in past yr: 0  Injury with Fall? 0  Risk for fall due to : No Fall Risks  Follow up Falls evaluation completed     Most recent depression screenings:    01/06/2023    8:11 AM 07/16/2022    9:19 AM  PHQ 2/9 Scores  PHQ - 2 Score 0 0    Vision:Not within last year  and Dental: No current dental problems and No regular dental care   Past Medical History:  Diagnosis Date   Allergy    oral antihistamine; seasonal.   Anxiety    Asthma    Depression    Hx of sinus bradycardia    Hypothyroidism    Kidney cysts    right   Pre-diabetes    Thyroid disease 01/19/2003   hypothyroidism   Past Surgical History:  Procedure Laterality Date   BREAST REDUCTION SURGERY Bilateral 12/08/2022   Procedure: MAMMARY REDUCTION  (BREAST);  Surgeon: Peggye Form, DO;  Location: [facility];  Service: Plastics;  Laterality: Bilateral;   TONSILLECTOMY     uterine ablation     Social History   Socioeconomic History   Marital status: Married    Spouse name: Not on file   Number of children: Not on file   Years of education: Not on file   Highest education level: Bachelor's degree (e.g., BA, AB, BS)  Occupational History   Not on file  Tobacco Use   Smoking status: Never   Smokeless tobacco: Never  Vaping Use   Vaping status: Never Used  Substance and Sexual Activity   Alcohol use: No   Drug  use: No   Sexual activity: Yes    Birth control/protection: Condom  Other Topics Concern   Not on file  Social History Narrative   Marital status: married x 12 years; happily married; no abuse; moved from [place] in 2014.      Children two children/sons (11, 6)      Lives: with husband, two children/sons.      Employment:  Homemaker      Tobacco: none      Alcohol:  Socially; 2-3 glasses of wine.      Drugs: none      Exercise:   Swimming. Sporadic.       Seatbelt:  100%; no texting      Guns:  None      Sunscreen: SPF 50-70   Social Drivers of Corporate investment banker Strain: Low Risk  (07/12/2022)   Overall Financial Resource Strain (CARDIA)    Difficulty of Paying Living Expenses: Not hard at all  Food Insecurity: No Food Insecurity (07/12/2022)   Hunger Vital Sign    Worried About Running Out of Food in the Last Year: Never true    Ran  Out of Food in the Last Year: Never true  Transportation Needs: No Transportation Needs (07/12/2022)   PRAPARE - Administrator, Civil Service (Medical): No    Lack of Transportation (Non-Medical): No  Physical Activity: Sufficiently Active (07/12/2022)   Exercise Vital Sign    Days of Exercise per Week: 5 days    Minutes of Exercise per Session: 90 min  Stress: No Stress Concern Present (07/12/2022)   [employer] of Occupational Health - Occupational Stress Questionnaire    Feeling of Stress : Only a little  Social Connections: Unknown (07/12/2022)   Social Connection and Isolation Panel [NHANES]    Frequency of Communication with Friends and Family: More than three times a week    Frequency of Social Gatherings with Friends and Family: Once a week    Attends Religious Services: Patient declined    Database administrator or Organizations: No    Attends Engineer, structural: Not on file    Marital Status: Married  Catering manager Violence: Not on file   Family History  Problem Relation Age of Onset   Cancer  Father 13       pancreatic cancer   Hypothyroidism Mother    Breast cancer Cousin    Allergies  Allergen Reactions   Gluten Meal Other (See Comments)      Patient Care Team: Elenore Paddy, NP as PCP - General (Nurse Practitioner)   Outpatient Medications Prior to Visit  Medication Sig   Albuterol Sulfate (PROAIR RESPICLICK) 108 (90 Base) MCG/ACT AEPB Inhale 2 puffs into the lungs every 6 (six) hours as needed.   SYNTHROID 137 MCG tablet Take 1 tablet (137 mcg total) by mouth daily before breakfast.   [DISCONTINUED] Metformin HCl 500 MG/5ML SOLN Take 5 mLs (500 mg total) by mouth daily with breakfast.   triamcinolone cream (KENALOG) 0.1 % Apply 1 Application topically as needed. (Patient not taking: Reported on 01/06/2023)   [DISCONTINUED] ondansetron (ZOFRAN) 4 MG tablet Take 1 tablet (4 mg total) by mouth every 8 (eight) hours as needed for nausea or vomiting.   No facility-administered medications prior to visit.    Review of Systems  Constitutional:  Negative for chills and fever.  Eyes:  Positive for blurred vision. Negative for double vision.  Respiratory:  Negative for cough, shortness of breath and wheezing.   Cardiovascular:  Negative for chest pain and palpitations.  Gastrointestinal:  Negative for abdominal pain, blood in stool and vomiting.  Genitourinary:  Negative for dysuria and hematuria.  Skin:  Negative for itching and rash.  Neurological:  Negative for dizziness, loss of consciousness and headaches.  Psychiatric/Behavioral:  Negative for depression and suicidal ideas. The patient is not nervous/anxious and does not have insomnia.           Objective:     BP 110/80 (Cuff Size: Large)   Pulse (!) 50   Temp 97.8 F (36.6 C)   Wt 180 lb 8 oz (81.9 kg)   SpO2 98%   BMI 30.74 kg/m  BP Readings from Last 3 Encounters:  01/06/23 110/80  12/28/22 (!) 159/83  12/08/22 137/77   Wt Readings from Last 3 Encounters:  01/06/23 180 lb 8 oz (81.9 kg)   12/08/22 183 lb 10.3 oz (83.3 kg)  12/01/22 181 lb 9.6 oz (82.4 kg)        01/06/2023    8:11 AM 07/16/2022    9:19 AM 01/14/2022   10:18 AM  PHQ9 SCORE  ONLY  PHQ-9 Total Score 0 0 0     Physical Exam Vitals reviewed.  Constitutional:      Appearance: Normal appearance.  HENT:     Head: Normocephalic and atraumatic.     Right Ear: Tympanic membrane, ear canal and external ear normal.     Left Ear: Tympanic membrane, ear canal and external ear normal.  Eyes:     General:        Right eye: No discharge.        Left eye: No discharge.     Extraocular Movements: Extraocular movements intact.     Conjunctiva/sclera: Conjunctivae normal.     Pupils: Pupils are equal, round, and reactive to light.  Neck:     Vascular: No carotid bruit.  Cardiovascular:     Rate and Rhythm: Normal rate and regular rhythm.     Pulses: Normal pulses.     Heart sounds: Normal heart sounds. No murmur heard. Pulmonary:     Effort: Pulmonary effort is normal.     Breath sounds: Normal breath sounds.  Chest:     Comments: Deferred per patient preference Abdominal:     General: Abdomen is flat. Bowel sounds are normal. There is no distension.     Palpations: Abdomen is soft. There is no mass.     Tenderness: There is no abdominal tenderness.  Musculoskeletal:        General: No tenderness.     Cervical back: Neck supple. No muscular tenderness.     Right lower leg: No edema.     Left lower leg: No edema.  Lymphadenopathy:     Cervical: No cervical adenopathy.     Upper Body:     Right upper body: No supraclavicular adenopathy.     Left upper body: No supraclavicular adenopathy.  Skin:    General: Skin is warm and dry.  Neurological:     General: No focal deficit present.     Mental Status: She is alert and oriented to person, place, and time.     Motor: No weakness.     Gait: Gait normal.  Psychiatric:        Mood and Affect: Mood normal.        Behavior: Behavior normal.         Judgment: Judgment normal.      No results found for any visits on 01/06/23.     Assessment & Plan:    Routine Health Maintenance and Physical Exam  Immunization History  Administered Date(s) Administered   Influenza, Seasonal, Injecte, Preservative Fre 12/06/2005   Influenza,inj,Quad PF,6+ Mos 01/18/2013, 12/31/2013, 09/10/2016, 09/28/2017, 11/30/2019, 11/28/2020, 12/03/2021   Influenza-Unspecified 10/19/2022   Tdap 01/18/2010, 12/03/2021    Health Maintenance  Topic Date Due   Colonoscopy  Never done   Cervical Cancer Screening (HPV/Pap Cotest)  09/10/2021   COVID-19 Vaccine (1 - 2024-25 season) Never done   Hepatitis C Screening  01/06/2024 (Originally 09/24/1992)   DTaP/Tdap/Td (3 - Td or Tdap) 12/04/2031   INFLUENZA VACCINE  Completed   HIV Screening  Completed   HPV VACCINES  Aged Out    Discussed health benefits of physical activity, and encouraged her to engage in regular exercise appropriate for her age and condition.  Problem List Items Addressed This Visit       Endocrine   Hypothyroidism   Chronic Check TSH, further recommendations may be made based upon his results.      Relevant Orders   CBC  Hemoglobin A1c   Comprehensive metabolic panel   Lipid panel   TSH     Other   Encounter for general adult medical examination with abnormal findings   Discussed preventative screening as well as healthy lifestyle.  Handout provided.      Relevant Orders   CBC   Hemoglobin A1c   Comprehensive metabolic panel   Lipid panel   TSH   Prediabetes   Chronic Per shared decision making patient would like to continue on metformin for off label treatment of obesity and prediabetes.  Will check A1c today.  Patient has lost some weight and was congratulated on this.  She does report some shakiness at times, question whether she may be having hypoglycemic episodes.  Recommend reducing metformin dose to 2.5 mL daily with first meal.  Offered prescription for  glucometer so patient can check blood sugars when she has this shakiness, she declined this for now.  Patient encouraged to let me know if shakiness continues.      Relevant Medications   Metformin HCl 500 MG/5ML SOLN   Other Relevant Orders   CBC   Hemoglobin A1c   Comprehensive metabolic panel   Lipid panel   TSH   Obesity   Chronic Per shared decision making patient would like to continue on metformin for off label treatment of obesity and prediabetes.  Will check A1c today.  Patient has lost some weight and was congratulated on this.  She does report some shakiness at times, question whether she may be having hypoglycemic episodes.  Recommend reducing metformin dose to 2.5 mL daily with first meal.  Offered prescription for glucometer so patient can check blood sugars when she has this shakiness, she declined this for now.  Patient encouraged to let me know if shakiness continues. Since undergoing breast reduction surgery, patient is starting to increase activity levels as allowed by her surgeon.  Patient encouraged to participate in regular exercises allowed by her surgeon.      Relevant Medications   Metformin HCl 500 MG/5ML SOLN   Other Relevant Orders   CBC   Hemoglobin A1c   Comprehensive metabolic panel   Lipid panel   TSH   Other Visit Diagnoses       Colon cancer screening    -  Primary   Relevant Orders   Ambulatory referral to Gastroenterology   CBC   Hemoglobin A1c   Comprehensive metabolic panel   Lipid panel   TSH      Return in about 3 months (around 04/06/2023) for F/U with Nashton Belson.  In addition to performing annual physical exam also performed office visit as detailed above.     Elenore Paddy, NP

## 2023-01-06 NOTE — Assessment & Plan Note (Addendum)
Chronic Per shared decision making patient would like to continue on metformin for off label treatment of obesity and prediabetes.  Will check A1c today.  Patient has lost some weight and was congratulated on this.  She does report some shakiness at times, question whether she may be having hypoglycemic episodes.  Recommend reducing metformin dose to 2.5 mL daily with first meal.  Offered prescription for glucometer so patient can check blood sugars when she has this shakiness, she declined this for now.  Patient encouraged to let me know if shakiness continues. Since undergoing breast reduction surgery, patient is starting to increase activity levels as allowed by her surgeon.  Patient encouraged to participate in regular exercises allowed by her surgeon.

## 2023-01-06 NOTE — Assessment & Plan Note (Signed)
Discussed preventative screening as well as healthy lifestyle.  Handout provided.

## 2023-01-06 NOTE — Assessment & Plan Note (Signed)
Chronic Per shared decision making patient would like to continue on metformin for off label treatment of obesity and prediabetes.  Will check A1c today.  Patient has lost some weight and was congratulated on this.  She does report some shakiness at times, question whether she may be having hypoglycemic episodes.  Recommend reducing metformin dose to 2.5 mL daily with first meal.  Offered prescription for glucometer so patient can check blood sugars when she has this shakiness, she declined this for now.  Patient encouraged to let me know if shakiness continues.

## 2023-01-07 ENCOUNTER — Encounter: Payer: Self-pay | Admitting: Nurse Practitioner

## 2023-01-07 ENCOUNTER — Other Ambulatory Visit: Payer: Self-pay | Admitting: Nurse Practitioner

## 2023-01-07 DIAGNOSIS — R450 Nervousness: Secondary | ICD-10-CM

## 2023-01-07 DIAGNOSIS — R748 Abnormal levels of other serum enzymes: Secondary | ICD-10-CM

## 2023-01-13 NOTE — Progress Notes (Signed)
Patient is a pleasant 47 year old female s/p bilateral breast reduction performed 12/08/2022 by Dr. Ulice Bold who presents to clinic for postoperative follow-up.   She was last seen here in clinic on 12/28/2022.  At that time, exam was benign.  Recommend continued activity modifications and compressive garments.  Follow-up as scheduled.    Today, patient is doing well.  No specific complaints aside from some protruding sutures from areolas that she is hoping can be trimmed.  She states that her low back pain has been completely alleviated from her surgery.  She is pleased.  On exam, breasts have excellent shape and symmetry.  NAC's are healthy and viable.  A few scattered sutures are removed without complication or difficulty.  Incisions are otherwise well-healed.  Increase activity as tolerated.  Can lift restrictions at this point.  Recommending Vaseline to the areolar incisions where sutures were removed x 5 days and then she can transition to silicone scar gel twice daily x 3 months throughout.  Follow-up only as needed.  Picture(s) obtained of the patient and placed in the chart were with the patient's or guardian's permission.

## 2023-01-14 ENCOUNTER — Ambulatory Visit (INDEPENDENT_AMBULATORY_CARE_PROVIDER_SITE_OTHER): Payer: 59 | Admitting: Physician Assistant

## 2023-01-14 VITALS — BP 162/81 | HR 58

## 2023-01-14 DIAGNOSIS — Z9889 Other specified postprocedural states: Secondary | ICD-10-CM

## 2023-01-24 ENCOUNTER — Other Ambulatory Visit: Payer: Self-pay | Admitting: Nurse Practitioner

## 2023-01-24 DIAGNOSIS — E66811 Obesity, class 1: Secondary | ICD-10-CM

## 2023-01-24 DIAGNOSIS — R7303 Prediabetes: Secondary | ICD-10-CM

## 2023-01-25 ENCOUNTER — Encounter: Payer: Self-pay | Admitting: Gastroenterology

## 2023-02-11 ENCOUNTER — Other Ambulatory Visit (INDEPENDENT_AMBULATORY_CARE_PROVIDER_SITE_OTHER): Payer: 59

## 2023-02-11 ENCOUNTER — Encounter: Payer: Self-pay | Admitting: Nurse Practitioner

## 2023-02-11 DIAGNOSIS — R748 Abnormal levels of other serum enzymes: Secondary | ICD-10-CM

## 2023-02-11 DIAGNOSIS — R450 Nervousness: Secondary | ICD-10-CM | POA: Diagnosis not present

## 2023-02-11 LAB — CORTISOL: Cortisol, Plasma: 13.1 ug/dL

## 2023-02-11 LAB — COMPREHENSIVE METABOLIC PANEL
ALT: 11 U/L (ref 0–35)
AST: 15 U/L (ref 0–37)
Albumin: 4.2 g/dL (ref 3.5–5.2)
Alkaline Phosphatase: 62 U/L (ref 39–117)
BUN: 15 mg/dL (ref 6–23)
CO2: 25 meq/L (ref 19–32)
Calcium: 9.1 mg/dL (ref 8.4–10.5)
Chloride: 106 meq/L (ref 96–112)
Creatinine, Ser: 0.77 mg/dL (ref 0.40–1.20)
GFR: 91.24 mL/min (ref >60.00)
Glucose, Bld: 99 mg/dL (ref 70–99)
Potassium: 4.3 meq/L (ref 3.5–5.1)
Sodium: 138 meq/L (ref 135–145)
Total Bilirubin: 0.5 mg/dL (ref 0.2–1.2)
Total Protein: 7 g/dL (ref 6.0–8.3)

## 2023-02-13 ENCOUNTER — Encounter: Payer: Self-pay | Admitting: Nurse Practitioner

## 2023-02-13 ENCOUNTER — Other Ambulatory Visit: Payer: Self-pay | Admitting: Nurse Practitioner

## 2023-02-13 DIAGNOSIS — N6320 Unspecified lump in the left breast, unspecified quadrant: Secondary | ICD-10-CM

## 2023-02-15 ENCOUNTER — Encounter: Payer: Self-pay | Admitting: Nurse Practitioner

## 2023-02-15 ENCOUNTER — Other Ambulatory Visit: Payer: Self-pay | Admitting: Nurse Practitioner

## 2023-02-15 DIAGNOSIS — E66811 Obesity, class 1: Secondary | ICD-10-CM

## 2023-02-15 DIAGNOSIS — R7303 Prediabetes: Secondary | ICD-10-CM

## 2023-02-15 DIAGNOSIS — N6320 Unspecified lump in the left breast, unspecified quadrant: Secondary | ICD-10-CM

## 2023-02-15 MED ORDER — METFORMIN HCL 500 MG PO TABS: 250.0000 mg | ORAL_TABLET | Freq: Every day | ORAL | 3 refills | Status: DC

## 2023-02-16 ENCOUNTER — Encounter: Payer: Self-pay | Admitting: Nurse Practitioner

## 2023-02-23 ENCOUNTER — Ambulatory Visit (AMBULATORY_SURGERY_CENTER): Payer: 59

## 2023-02-23 VITALS — Ht 64.25 in | Wt 180.0 lb

## 2023-02-23 DIAGNOSIS — Z1211 Encounter for screening for malignant neoplasm of colon: Secondary | ICD-10-CM

## 2023-02-23 MED ORDER — SUFLAVE 178.7 G PO SOLR: 1.0000 | ORAL | 0 refills | Status: DC

## 2023-02-23 NOTE — Progress Notes (Signed)

## 2023-03-01 ENCOUNTER — Encounter: Payer: Self-pay | Admitting: Gastroenterology

## 2023-03-02 ENCOUNTER — Ambulatory Visit
Admission: RE | Admit: 2023-03-02 | Discharge: 2023-03-02 | Disposition: A | Discharge status: Home / Self Care | Payer: 59 | Source: Ambulatory Visit | Attending: Nurse Practitioner | Admitting: Nurse Practitioner

## 2023-03-02 ENCOUNTER — Encounter: Payer: Self-pay | Admitting: Nurse Practitioner

## 2023-03-02 DIAGNOSIS — N6320 Unspecified lump in the left breast, unspecified quadrant: Secondary | ICD-10-CM

## 2023-03-09 ENCOUNTER — Ambulatory Visit: Payer: 59 | Admitting: Gastroenterology

## 2023-03-09 ENCOUNTER — Encounter: Payer: Self-pay | Admitting: Gastroenterology

## 2023-03-09 VITALS — BP 130/50 | HR 55 | Temp 97.8°F | Resp 14 | Ht 64.25 in | Wt 189.0 lb

## 2023-03-09 DIAGNOSIS — Z1211 Encounter for screening for malignant neoplasm of colon: Secondary | ICD-10-CM | POA: Diagnosis present

## 2023-03-09 DIAGNOSIS — K644 Residual hemorrhoidal skin tags: Secondary | ICD-10-CM

## 2023-03-09 DIAGNOSIS — D128 Benign neoplasm of rectum: Secondary | ICD-10-CM

## 2023-03-09 DIAGNOSIS — D122 Benign neoplasm of ascending colon: Secondary | ICD-10-CM | POA: Diagnosis not present

## 2023-03-09 DIAGNOSIS — K5730 Diverticulosis of large intestine without perforation or abscess without bleeding: Secondary | ICD-10-CM

## 2023-03-09 DIAGNOSIS — K648 Other hemorrhoids: Secondary | ICD-10-CM | POA: Diagnosis not present

## 2023-03-09 DIAGNOSIS — D120 Benign neoplasm of cecum: Secondary | ICD-10-CM

## 2023-03-09 MED ORDER — SODIUM CHLORIDE 0.9 % IV SOLN: 500.0000 mL | Freq: Once | INTRAVENOUS | Status: DC

## 2023-03-09 NOTE — Op Note (Signed)
[facility] Patient Name: Rebecca Meza Procedure Date: 03/09/2023 8:27 AM MRN: [id number] Endoscopist: Napoleon Form , MD, [id number] Age: 48 Referring MD:  Date of Birth: 11/21/1975 Gender: Female Account #: [id number] Procedure:                Colonoscopy Indications:              Screening for colorectal malignant neoplasm Medicines:                Monitored Anesthesia Care Procedure:                Pre-Anesthesia Assessment:                           - Prior to the procedure, a History and Physical                            was performed, and patient medications and                            allergies were reviewed. The patient's tolerance of                            previous anesthesia was also reviewed. The risks                            and benefits of the procedure and the sedation                            options and risks were discussed with the patient.                            All questions were answered, and informed consent                            was obtained. Prior Anticoagulants: The patient has                            taken no anticoagulant or antiplatelet agents. ASA                            Grade Assessment: III - A patient with severe                            systemic disease. After reviewing the risks and                            benefits, the patient was deemed in satisfactory                            condition to undergo the procedure.                           After obtaining informed consent, the colonoscope  was passed under direct vision. Throughout the                            procedure, the patient's blood pressure, pulse, and                            oxygen saturations were monitored continuously. The                            Olympus Scope [id number] was introduced through the                            anus and advanced to the the cecum, identified by                             appendiceal orifice and ileocecal valve. The                            colonoscopy was performed without difficulty. The                            patient tolerated the procedure well. The quality                            of the bowel preparation was good. The ileocecal                            valve, appendiceal orifice, and rectum were                            photographed. Scope In: 8:39:31 AM Scope Out: 9:01:00 AM Scope Withdrawal Time: 0 hours 13 minutes 11 seconds  Total Procedure Duration: 0 hours 21 minutes 29 seconds  Findings:                 The perianal and digital rectal examinations were                            normal.                           Two semi-pedunculated polyps were found in the                            rectum and ascending colon. The polyps were 6 to 12                            mm in size. These polyps were removed with a hot                            snare. Resection and retrieval were complete.                           A 5 mm polyp was found in the cecum. The polyp was  sessile. The polyp was removed with a cold snare.                            Resection and retrieval were complete.                           Scattered small-mouthed diverticula were found in                            the sigmoid colon and descending colon.                           Non-bleeding external and internal hemorrhoids were                            found during retroflexion. The hemorrhoids were                            medium-sized. Complications:            No immediate complications. Estimated Blood Loss:     Estimated blood loss was minimal. Impression:               - Two 6 to 12 mm polyps in the rectum and in the                            ascending colon, removed with a hot snare. Resected                            and retrieved.                           - One 5 mm polyp in the cecum, removed with a cold                             snare. Resected and retrieved.                           - Diverticulosis in the sigmoid colon and in the                            descending colon.                           - Non-bleeding external and internal hemorrhoids. Recommendation:           - Patient has a contact number available for                            emergencies. The signs and symptoms of potential                            delayed complications were discussed with the                            patient. Return to  normal activities tomorrow.                            Written discharge instructions were provided to the                            patient.                           - Resume previous diet.                           - Continue present medications.                           - Await pathology results.                           - Repeat colonoscopy in 3 years for surveillance                            based on pathology results. Napoleon Form, MD 03/09/2023 9:11:04 AM This report has been signed electronically.

## 2023-03-09 NOTE — Progress Notes (Unsigned)
[facility] History and Physical   Primary Care Physician:  Elenore Paddy, NP   Reason for Procedure:  Colorectal cancer screening  Plan:    Screening colonoscopy with possible interventions as needed     HPI: Rebecca Meza is a very pleasant 48 y.o. female here for screening colonoscopy. Denies any nausea, vomiting, abdominal pain, melena or bright red blood per rectum  The risks and benefits as well as alternatives of endoscopic procedure(s) have been discussed and reviewed. All questions answered. The patient agrees to proceed.    Past Medical History:  Diagnosis Date   Allergy    oral antihistamine; seasonal.   Anxiety    Asthma    Depression    Hx of sinus bradycardia    Hypothyroidism    Kidney cysts    right   Pre-diabetes    Thyroid disease 01/19/2003   hypothyroidism    Past Surgical History:  Procedure Laterality Date   BREAST REDUCTION SURGERY Bilateral 12/08/2022   Procedure: MAMMARY REDUCTION  (BREAST);  Surgeon: Peggye Form, DO;  Location: [facility];  Service: Plastics;  Laterality: Bilateral;   TONSILLECTOMY     uterine ablation      Prior to Admission medications   Medication Sig Start Date End Date Taking? Authorizing Provider  Albuterol Sulfate (PROAIR RESPICLICK) 108 (90 Base) MCG/ACT AEPB Inhale 2 puffs into the lungs every 6 (six) hours as needed. 12/03/21  Yes Elenore Paddy, NP  metFORMIN (GLUCOPHAGE) 500 MG tablet Take 0.5 tablets (250 mg total) by mouth daily with breakfast. 02/15/23  Yes Elenore Paddy, NP  SYNTHROID 137 MCG tablet Take 1 tablet (137 mcg total) by mouth daily before breakfast. 12/02/22  Yes Carlus Pavlov, MD  triamcinolone cream (KENALOG) 0.1 % Apply 1 Application topically as needed. 08/31/22  Yes [provider]    Current Outpatient Medications  Medication Sig Dispense Refill   Albuterol Sulfate (PROAIR RESPICLICK) 108 (90 Base) MCG/ACT AEPB Inhale 2 puffs into the  lungs every 6 (six) hours as needed. 1 each 2   metFORMIN (GLUCOPHAGE) 500 MG tablet Take 0.5 tablets (250 mg total) by mouth daily with breakfast. 45 tablet 3   SYNTHROID 137 MCG tablet Take 1 tablet (137 mcg total) by mouth daily before breakfast. 90 tablet 3   triamcinolone cream (KENALOG) 0.1 % Apply 1 Application topically as needed.     Current Facility-Administered Medications  Medication Dose Route Frequency Provider Last Rate Last Admin   0.9 %  sodium chloride infusion  500 mL Intravenous Once Napoleon Form, MD        Allergies as of 03/09/2023 - Review Complete 03/09/2023  Allergen Reaction Noted   Gluten meal Other (See Comments) 07/16/2022    Family History  Problem Relation Age of Onset   Hypothyroidism Mother    Cancer Father 7       pancreatic cancer   Colon cancer Paternal Grandfather    Breast cancer Cousin    Rectal cancer Neg Hx    Stomach cancer Neg Hx     Social History   Socioeconomic History   Marital status: Married    Spouse name: Not on file   Number of children: Not on file   Years of education: Not on file   Highest education level: Bachelor's degree (e.g., BA, AB, BS)  Occupational History   Not on file  Tobacco Use   Smoking status: Never   Smokeless tobacco: Never  Vaping Use  Vaping status: Never Used  Substance and Sexual Activity   Alcohol use: No   Drug use: No   Sexual activity: Yes    Birth control/protection: Condom  Other Topics Concern   Not on file  Social History Narrative   Marital status: married x 12 years; happily married; no abuse; moved from [place] in 2014.      Children two children/sons (11, 6)      Lives: with husband, two children/sons.      Employment:  Homemaker      Tobacco: none      Alcohol:  Socially; 2-3 glasses of wine.      Drugs: none      Exercise:   Swimming. Sporadic.       Seatbelt:  100%; no texting      Guns:  None      Sunscreen: SPF 50-70   Social Drivers of Research scientist (physical sciences) Strain: Low Risk  (07/12/2022)   Overall Financial Resource Strain (CARDIA)    Difficulty of Paying Living Expenses: Not hard at all  Food Insecurity: No Food Insecurity (07/12/2022)   Hunger Vital Sign    Worried About Running Out of Food in the Last Year: Never true    Ran Out of Food in the Last Year: Never true  Transportation Needs: No Transportation Needs (07/12/2022)   PRAPARE - Administrator, Civil Service (Medical): No    Lack of Transportation (Non-Medical): No  Physical Activity: Sufficiently Active (07/12/2022)   Exercise Vital Sign    Days of Exercise per Week: 5 days    Minutes of Exercise per Session: 90 min  Stress: No Stress Concern Present (07/12/2022)   [employer] of Occupational Health - Occupational Stress Questionnaire    Feeling of Stress : Only a little  Social Connections: Unknown (07/12/2022)   Social Connection and Isolation Panel [NHANES]    Frequency of Communication with Friends and Family: More than three times a week    Frequency of Social Gatherings with Friends and Family: Once a week    Attends Religious Services: Patient declined    Database administrator or Organizations: No    Attends Engineer, structural: Not on file    Marital Status: Married  Catering manager Violence: Not on file    Review of Systems:  All other review of systems negative except as mentioned in the HPI.  Physical Exam: Vital signs in last 24 hours: BP (!) 140/86 (BP Location: Right Arm, Patient Position: Sitting, Cuff Size: Normal)   Pulse (!) 56   Temp 97.8 F (36.6 C) (Temporal)   Ht 5' 4.25" (1.632 m)   Wt 189 lb (85.7 kg)   SpO2 99%   BMI 32.19 kg/m  General:   Alert, NAD Lungs:  Clear .   Heart:  Regular rate and rhythm Abdomen:  Soft, nontender and nondistended. Neuro/Psych:  Alert and cooperative. Normal mood and affect. A and O x 3  Reviewed labs, radiology imaging, old records and pertinent past GI work up  Patient is  appropriate for planned procedure(s) and anesthesia in an ambulatory setting   K. Scherry Ran , MD [phone]

## 2023-03-09 NOTE — Progress Notes (Unsigned)
 Vss nad trans to pacu

## 2023-03-09 NOTE — Progress Notes (Signed)
 Called to room to assist during endoscopic procedure.  Patient ID and intended procedure confirmed with present staff. Received instructions for my participation in the procedure from the performing physician.

## 2023-03-09 NOTE — Patient Instructions (Addendum)
-  Handout on polyps provided -await pathology results -repeat colonoscopy in 3 years for surveillance recommended.  -Continue present medications   YOU HAD AN ENDOSCOPIC PROCEDURE TODAY AT [facility]:   Refer to the procedure report that was given to you for any specific questions about what was found during the examination.  If the procedure report does not answer your questions, please call your gastroenterologist to clarify.  If you requested that your care partner not be given the details of your procedure findings, then the procedure report has been included in a sealed envelope for you to review at your convenience later.  YOU SHOULD EXPECT: Some feelings of bloating in the abdomen. Passage of more gas than usual.  Walking can help get rid of the air that was put into your GI tract during the procedure and reduce the bloating. If you had a lower endoscopy (such as a colonoscopy or flexible sigmoidoscopy) you may notice spotting of blood in your stool or on the toilet paper. If you underwent a bowel prep for your procedure, you may not have a normal bowel movement for a few days.  Please Note:  You might notice some irritation and congestion in your nose or some drainage.  This is from the oxygen used during your procedure.  There is no need for concern and it should clear up in a day or so.  SYMPTOMS TO REPORT IMMEDIATELY:  Following lower endoscopy (colonoscopy or flexible sigmoidoscopy):  Excessive amounts of blood in the stool  Significant tenderness or worsening of abdominal pains  Swelling of the abdomen that is new, acute  Fever of 100F or higher  For urgent or emergent issues, a gastroenterologist can be reached at any hour by calling [phone]. Do not use MyChart messaging for urgent concerns.    DIET:  We do recommend a small meal at first, but then you may proceed to your regular diet.  Drink plenty of fluids but you should avoid alcoholic beverages for  24 hours.  ACTIVITY:  You should plan to take it easy for the rest of today and you should NOT DRIVE or use heavy machinery until tomorrow (because of the sedation medicines used during the test).    FOLLOW UP: Our staff will call the number listed on your records the next business day following your procedure.  We will call around 7:15- 8:00 am to check on you and address any questions or concerns that you may have regarding the information given to you following your procedure. If we do not reach you, we will leave a message.     If any biopsies were taken you will be contacted by phone or by letter within the next 1-3 weeks.  Please call us at [phone] if you have not heard about the biopsies in 3 weeks.    SIGNATURES/CONFIDENTIALITY: You and/or your care partner have signed paperwork which will be entered into your electronic medical record.  These signatures attest to the fact that that the information above on your After Visit Summary has been reviewed and is understood.  Full responsibility of the confidentiality of this discharge information lies with you and/or your care-partner.

## 2023-03-09 NOTE — Progress Notes (Unsigned)
 Vitals-Rebecca Meza  Pt's states no medical or surgical changes since previsit or office visit.

## 2023-03-10 ENCOUNTER — Telehealth: Payer: Self-pay

## 2023-03-10 NOTE — Telephone Encounter (Signed)
  Follow up Call-     03/09/2023    7:38 AM  Call back number  Permission to leave phone message Yes     Patient questions:  Do you have a fever, pain , or abdominal swelling? No. Pain Score  0 *  Have you tolerated food without any problems? Yes.    Have you been able to return to your normal activities? Yes.    Do you have any questions about your discharge instructions: Diet   No. Medications  No. Follow up visit  No.  Do you have questions or concerns about your Care? No.  Actions: * If pain score is 4 or above: No action needed, pain <4.

## 2023-03-14 LAB — SURGICAL PATHOLOGY

## 2023-04-07 ENCOUNTER — Ambulatory Visit: Payer: 59 | Admitting: Nurse Practitioner

## 2023-04-25 ENCOUNTER — Encounter: Payer: Self-pay | Admitting: Gastroenterology

## 2023-04-28 ENCOUNTER — Ambulatory Visit: Admitting: Nurse Practitioner

## 2023-04-28 VITALS — BP 122/74 | HR 46 | Temp 97.9°F | Ht 64.0 in | Wt 188.1 lb

## 2023-04-28 DIAGNOSIS — E66811 Obesity, class 1: Secondary | ICD-10-CM

## 2023-04-28 DIAGNOSIS — N951 Menopausal and female climacteric states: Secondary | ICD-10-CM | POA: Diagnosis not present

## 2023-04-28 DIAGNOSIS — R7303 Prediabetes: Secondary | ICD-10-CM

## 2023-04-28 DIAGNOSIS — Z6832 Body mass index (BMI) 32.0-32.9, adult: Secondary | ICD-10-CM | POA: Diagnosis not present

## 2023-04-28 MED ORDER — METFORMIN HCL 500 MG PO TABS: 250.0000 mg | ORAL_TABLET | ORAL | Status: DC

## 2023-04-28 MED ORDER — METFORMIN HCL 500 MG PO TABS: 500.0000 mg | ORAL_TABLET | ORAL | Status: DC

## 2023-04-28 NOTE — Assessment & Plan Note (Signed)
 Complaints do seem consistent with menopause.  Patient was encouraged to follow-up with OB/GYN and discuss further evaluation and management with them. She reports her understanding.

## 2023-04-28 NOTE — Assessment & Plan Note (Signed)
 Chronic, stable Patient encouraged to consider calorie deficit diet in addition to continuing exercising regularly. Referral to healthy weight and wellness center offered but declined by patient today.

## 2023-04-28 NOTE — Progress Notes (Signed)
 Established Patient Office Visit  Subjective   Patient ID: Rebecca Meza, female    DOB: Sep 02, 1975  Age: 48 y.o. MRN: [id number]  Chief Complaint  Patient presents with   Medical Management of Chronic Issues    3 month follow, want to start some hormonal replacement     Patient is here for follow-up.  Is taking metformin 500 mg by mouth 3 times a week.  This is for treatment of prediabetes and to help with weight loss.  Has not experienced much weight loss as side effect.  Is not interested in injectable weight loss medications.  Also interested in healthy weight and wellness center at this time.  Has been continuing to exercise regularly including cardio and weightlifting.  She feels that she may be menopausal as well as she is experiencing insomnia, feeling scatterbrained, may have had hot flashes recently but seems to feel that these have resolved.  She did have a uterine ablation in 2019 so she has not had a menstrual cycle since then.    ROS: see HPI    Objective:     BP 122/74   Pulse (!) 46   Temp 97.9 F (36.6 C) (Temporal)   Ht 5\' 4"  (1.626 m)   Wt 188 lb 2 oz (85.3 kg)   SpO2 95%   BMI 32.29 kg/m  BP Readings from Last 3 Encounters:  04/28/23 122/74  03/09/23 (!) 130/50  01/14/23 (!) 162/81   Wt Readings from Last 3 Encounters:  04/28/23 188 lb 2 oz (85.3 kg)  03/09/23 189 lb (85.7 kg)  02/23/23 180 lb (81.6 kg)      Physical Exam Vitals reviewed.  Constitutional:      General: She is not in acute distress.    Appearance: Normal appearance.  HENT:     Head: Normocephalic and atraumatic.  Neck:     Vascular: No carotid bruit.  Cardiovascular:     Rate and Rhythm: Normal rate and regular rhythm.     Pulses: Normal pulses.     Heart sounds: Normal heart sounds.  Pulmonary:     Effort: Pulmonary effort is normal.     Breath sounds: Normal breath sounds.  Skin:    General: Skin is warm and dry.  Neurological:     General: No focal deficit  present.     Mental Status: She is alert and oriented to person, place, and time.  Psychiatric:        Mood and Affect: Mood normal.        Behavior: Behavior normal.        Judgment: Judgment normal.      No results found for any visits on 04/28/23.    The 10-year ASCVD risk score (Arnett DK, et al., 2019) is: 1.2%    Assessment & Plan:   Problem List Items Addressed This Visit       Other   Prediabetes   Chronic, stable Last A1c 5.6 Continue metformin 500 mg 3 times weekly Follow-up when due for next annual physical exam at which point A1c will be rechecked.      Relevant Medications   metFORMIN (GLUCOPHAGE) 500 MG tablet (Start on 04/29/2023)   Obesity   Chronic, stable Patient encouraged to consider calorie deficit diet in addition to continuing exercising regularly. Referral to healthy weight and wellness center offered but declined by patient today.      Relevant Medications   metFORMIN (GLUCOPHAGE) 500 MG tablet (Start on 04/29/2023)  Menopausal symptoms - Primary   Complaints do seem consistent with menopause.  Patient was encouraged to follow-up with OB/GYN and discuss further evaluation and management with them. She reports her understanding.        Return in about 9 months (around 01/28/2024) for CPE with Kurt Azimi.    Elenore Paddy, NP

## 2023-04-28 NOTE — Assessment & Plan Note (Signed)
 Chronic, stable Last A1c 5.6 Continue metformin 500 mg 3 times weekly Follow-up when due for next annual physical exam at which point A1c will be rechecked.

## 2023-04-29 NOTE — Progress Notes (Signed)
 Rebecca Meza D.[facility] [address] [place] [address] Phone: [phone]   Assessment and Plan:     1. Pain of finger of right hand 2. Right hand pain -Chronic with exacerbation, initial sports medicine visit - Contusion of right fourth DIP from a weight compressing this area on accident during a workout clean up - X-ray obtained in clinic.  My interpretation: No acute fracture or dislocation - Recommend buddy taping 3rd and 4th digits when physically active to decrease strain over fourth PIP - Start meloxicam 15 mg daily x2 weeks.  If still having pain after 2 weeks, complete 3rd-week of NSAID. May use remaining NSAID as needed once daily for pain control.  Do not to use additional over-the-counter NSAIDs (ibuprofen, naproxen, Advil, Aleve, etc.) while taking prescription NSAIDs.  May use Tylenol [phone] mg 2 to 3 times a day for breakthrough pain.   3. Right elbow pain 4. Lateral epicondylitis of right elbow 5. Medial epicondylitis of right elbow  -Chronic with exacerbation, initial sports medicine visit - Consistent with lateral and medial epicondylitis of right elbow likely due to physical activities, workouts - Start meloxicam 15 mg daily x2 weeks.  If still having pain after 2 weeks, complete 3rd-week of NSAID. May use remaining NSAID as needed once daily for pain control.  Do not to use additional over-the-counter NSAIDs (ibuprofen, naproxen, Advil, Aleve, etc.) while taking prescription NSAIDs.  May use Tylenol [phone] mg 2 to 3 times a day for breakthrough pain. - Start HEP for elbow - X-ray obtained in clinic.  My interpretation: No acute fracture or dislocation - May trial wrist elbow strap versus wrist brace to decrease tension over medial lateral elbow  15 additional minutes spent for educating Therapeutic Home Exercise Program.  This included exercises focusing on stretching, strengthening, with focus on eccentric aspects.    Long term goals include an improvement in range of motion, strength, endurance as well as avoiding reinjury. Patient's frequency would include in 1-2 times a day, 3-5 times a week for a duration of 6-12 weeks. Proper technique shown and discussed handout in great detail with ATC.  All questions were discussed and answered.    Pertinent previous records reviewed include none  Follow Up: 4 weeks for reevaluation.  If no improvement or worsening of symptoms, could consider physical therapy versus ECSWT versus CSI to fourth PIP   Subjective:   I, Rebecca Meza, am serving as a Neurosurgeon for Doctor Ulysees Gander  Chief Complaint: finger and elbow pain   HPI:   05/02/2023 Patient is a 48 year old female with finger and elbow pain. Patient states dropped dumbbell on her finger back in January. Splinted for a week, decreased ROM, still gets swelling.   Right elbow pain since last summer. NO MOI. Decreased ROM . Tylenol, meloxicam  for the pain and that doesn't help. Pain goes down the front of the arm. No numbness or tingling. No decrease in grip strength.   Relevant Historical Information: Hypothyroidism, prediabetes  Additional pertinent review of systems negative.   Current Outpatient Medications:    Albuterol Sulfate (PROAIR RESPICLICK) 108 (90 Base) MCG/ACT AEPB, Inhale 2 puffs into the lungs every 6 (six) hours as needed., Disp: 1 each, Rfl: 2   meloxicam (MOBIC) 15 MG tablet, Take 1 tablet (15 mg total) by mouth daily., Disp: 30 tablet, Rfl: 0   metFORMIN (GLUCOPHAGE) 500 MG tablet, Take 1 tablet (500 mg total) by mouth 3 (three) times  a week., Disp: , Rfl:    SYNTHROID 137 MCG tablet, Take 1 tablet (137 mcg total) by mouth daily before breakfast., Disp: 90 tablet, Rfl: 3   triamcinolone cream (KENALOG) 0.1 %, Apply 1 Application topically as needed., Disp: , Rfl:    Objective:     Vitals:   05/02/23 0752  BP: 130/76  Pulse: (!) 49  SpO2: 99%  Weight: 188 lb (85.3 kg)  Height:  5\' 4"  (1.626 m)      Body mass index is 32.27 kg/m.    Physical Exam:    General: Appears well, no acute distress, nontoxic and pleasant Neck: FROM, no pain Neuro: sensation is intact distally with no deficits, strenghth is 5/5 in elbow flexors/extenders/supinator/pronators and wrist flexors/extensors Psych: no evidence of anxiety or depression  Right elbow: No deformity, swelling or muscle wasting Normal Carrying angle ROM:0-140, supination and pronation 90 TTP medial and lateral epicondyle, supinator, pronators NTTP over triceps, ticeps tendon, olecronon, antecubital fossa, biceps tendon, supinator, pronator Negative tinnels over cubital tunnel   pain with resisted wrist and middle digit extension  no pain with resisted wrist flexion   pain with resisted supination   pain with resisted pronation Negative valgus stress Negative varus stress Negative milking maneuver   Right fourth finger: No gross deformity, no effusion.  NTTP to fourth PIP in all directions.  Discomfort with DIP compression medial/lateral.  Full ROM with minimal pain.  No laxity with passive flexion, extension, varus, valgus stress  Electronically signed by:  Marshall Skeeter D.[facility] 8:25 AM 05/02/23

## 2023-05-02 ENCOUNTER — Ambulatory Visit (INDEPENDENT_AMBULATORY_CARE_PROVIDER_SITE_OTHER)

## 2023-05-02 ENCOUNTER — Ambulatory Visit: Admitting: Sports Medicine

## 2023-05-02 VITALS — BP 130/76 | HR 49 | Ht 64.0 in | Wt 188.0 lb

## 2023-05-02 DIAGNOSIS — M7701 Medial epicondylitis, right elbow: Secondary | ICD-10-CM

## 2023-05-02 DIAGNOSIS — M79641 Pain in right hand: Secondary | ICD-10-CM

## 2023-05-02 DIAGNOSIS — M25521 Pain in right elbow: Secondary | ICD-10-CM | POA: Diagnosis not present

## 2023-05-02 DIAGNOSIS — M7711 Lateral epicondylitis, right elbow: Secondary | ICD-10-CM | POA: Diagnosis not present

## 2023-05-02 DIAGNOSIS — M79644 Pain in right finger(s): Secondary | ICD-10-CM

## 2023-05-02 MED ORDER — MELOXICAM 15 MG PO TABS: 15.0000 mg | ORAL_TABLET | Freq: Every day | ORAL | 0 refills | Status: DC

## 2023-05-02 NOTE — Patient Instructions (Signed)
-   Start meloxicam 15 mg daily x2 weeks.  If still having pain after 2 weeks, complete 3rd-week of NSAID. May use remaining NSAID as needed once daily for pain control.  Do not to use additional over-the-counter NSAIDs (ibuprofen, naproxen, Advil, Aleve, etc.) while taking prescription NSAIDs.  May use Tylenol [phone] mg 2 to 3 times a day for breakthrough pain. Can get tennis elbow strap or wrist brace at [employer], [employer], or other similar Can buddy tape when physical active Elbow HEP  4 week follow up

## 2023-05-03 ENCOUNTER — Encounter: Payer: Self-pay | Admitting: Sports Medicine

## 2023-05-27 NOTE — Progress Notes (Unsigned)
    Ben Jackson D.[facility] [address] [place] [address] Phone: [phone]   Assessment and Plan:     There are no diagnoses linked to this encounter.  ***   Pertinent previous records reviewed include ***    Follow Up: ***     Subjective:   I, Mena Simonis, am serving as a Neurosurgeon for Doctor Ulysees Gander   Chief Complaint: finger and elbow pain    HPI:    05/02/2023 Patient is a 48 year old female with finger and elbow pain. Patient states dropped dumbbell on her finger back in January. Splinted for a week, decreased ROM, still gets swelling.    Right elbow pain since last summer. NO MOI. Decreased ROM . Tylenol , meloxicam   for the pain and that doesn't help. Pain goes down the front of the arm. No numbness or tingling. No decrease in grip strength.   05/30/2023 Patient states   Relevant Historical Information: Hypothyroidism, prediabetes    Additional pertinent review of systems negative.   Current Outpatient Medications:    Albuterol  Sulfate (PROAIR  RESPICLICK) 108 (90 Base) MCG/ACT AEPB, Inhale 2 puffs into the lungs every 6 (six) hours as needed., Disp: 1 each, Rfl: 2   meloxicam  (MOBIC ) 15 MG tablet, Take 1 tablet (15 mg total) by mouth daily., Disp: 30 tablet, Rfl: 0   metFORMIN  (GLUCOPHAGE ) 500 MG tablet, Take 1 tablet (500 mg total) by mouth 3 (three) times a week., Disp: , Rfl:    SYNTHROID  137 MCG tablet, Take 1 tablet (137 mcg total) by mouth daily before breakfast., Disp: 90 tablet, Rfl: 3   triamcinolone cream (KENALOG) 0.1 %, Apply 1 Application topically as needed., Disp: , Rfl:    Objective:     There were no vitals filed for this visit.    There is no height or weight on file to calculate BMI.    Physical Exam:    ***   Electronically signed by:  Marshall Skeeter D.[facility] 11:46 AM 05/27/23

## 2023-05-30 ENCOUNTER — Ambulatory Visit: Admitting: Sports Medicine

## 2023-05-30 VITALS — BP 126/82 | HR 87 | Ht 64.0 in | Wt 189.0 lb

## 2023-05-30 DIAGNOSIS — M25521 Pain in right elbow: Secondary | ICD-10-CM

## 2023-05-30 DIAGNOSIS — M79641 Pain in right hand: Secondary | ICD-10-CM

## 2023-05-30 DIAGNOSIS — M79644 Pain in right finger(s): Secondary | ICD-10-CM | POA: Diagnosis not present

## 2023-05-30 DIAGNOSIS — M7711 Lateral epicondylitis, right elbow: Secondary | ICD-10-CM | POA: Diagnosis not present

## 2023-05-30 DIAGNOSIS — M7701 Medial epicondylitis, right elbow: Secondary | ICD-10-CM

## 2023-09-16 LAB — HM PAP SMEAR: HPV, high-risk: NEGATIVE

## 2023-12-01 ENCOUNTER — Other Ambulatory Visit

## 2023-12-01 ENCOUNTER — Ambulatory Visit: Payer: 59 | Admitting: Internal Medicine

## 2023-12-01 ENCOUNTER — Encounter: Payer: Self-pay | Admitting: Internal Medicine

## 2023-12-01 VITALS — BP 122/80 | HR 54 | Ht 64.0 in | Wt 185.6 lb

## 2023-12-01 DIAGNOSIS — M542 Cervicalgia: Secondary | ICD-10-CM

## 2023-12-01 DIAGNOSIS — E039 Hypothyroidism, unspecified: Secondary | ICD-10-CM | POA: Diagnosis not present

## 2023-12-01 NOTE — Progress Notes (Signed)
 Patient ID: Rebecca Meza, female   DOB: Aug 18, 1975, 48 y.o.   MRN: [id number]  HPI  Rebecca Meza is a 48 y.o.-year-old female, returning for follow-up for hypothyroidism. Last visit 1 year ago.  Interim history: She lost a net 17 pounds for last visit, intentionally - adjusting diet, increased protein, exercise.  However, since last visit, she gained another 4 pounds.  She is on metformin  which is mostly use to help with insulin resistance and weight loss. She started HRT in 08/2023 - at that time, she had skin itching at that time. This helped.  Reviewed history: Pt. has been dx with hypothyroidism in 2004; she was started on Levothyroxine  (LT4) - but did not feel good (fatigued, sluggish); then Armour (but TFTs difficult to control), then Synthroid  d.a.w. in 2005.  She is on Synthroid  137 mcg (dose decreased 06/2022) daily: - in am - fasting - at least 30 min from b'fast - no calcium - no iron - + multivitamins at night - no PPIs - off Biotin in B complex (75 mcg)  Off ashwagandha. On extra strength probiotics Olly - skin is better, also helps with gluten sensitivity. On probiotics and fiber supplements at night.  Reviewed her TFTs: Lab Results  Component Value Date   TSH 0.55 01/06/2023   TSH 0.80 12/01/2022   TSH 0.15 (L) 07/16/2022   TSH 1.86 11/30/2021   TSH 2.75 11/28/2020   TSH 2.57 11/30/2019   TSH 2.24 10/05/2018   TSH 7.67 (H) 09/28/2017   TSH 4.45 03/29/2017   TSH 0.06 (L) 09/28/2016   FREET4 1.31 12/01/2022   FREET4 1.14 11/30/2021   FREET4 1.20 11/28/2020   FREET4 1.30 11/30/2019   FREET4 1.05 10/05/2018   FREET4 0.73 09/28/2017   FREET4 0.90 03/29/2017   FREET4 1.64 (H) 09/28/2016   FREET4 1.25 03/29/2016   FREET4 1.26 09/29/2015    Her TPO antibodies were not elevated: Component     Latest Ref Rng 05/29/2015  Thyroperoxidase Ab SerPl-aCnc     <9 IU/mL 3   Pt denies: - feeling nodules in neck - hoarseness - dysphagia - choking  She has  + FH of thyroid  disorders in: hypothyroidism in mother and possible MGM. No FH of thyroid  cancer. No h/o radiation tx to head or neck. No herbal supplements. No Biotin use. No recent steroids use.   She is seeing psychiatry (for anxiety, ADHD) - on Vyvanse, Celexa . She had an endometrial ablation in 01/2020. She had a normal cortisol level: Component     Latest Ref Rng 02/11/2023  Cortisol, Plasma     ug/dL 86.8   She had been slightly elevated HbA1c x1, but this normalized: Lab Results  Component Value Date   HGBA1C 5.6 01/06/2023   HGBA1C 5.6 07/16/2022   HGBA1C 5.7 12/03/2021   HGBA1C 5.1 09/10/2016   HGBA1C 5.5 08/19/2015   HGBA1C 5.5 07/23/2013  On Metformin  at night.  ROS: + see HPI  I reviewed pt's medications, allergies, PMH, social hx, family hx, and changes were documented in the history of present illness. Otherwise, unchanged from my initial visit note.  Past Medical History:  Diagnosis Date   Allergy    oral antihistamine; seasonal.   Anxiety    Asthma    Depression    Hx of sinus bradycardia    Hypothyroidism    Kidney cysts    right   Pre-diabetes    Thyroid  disease 01/19/2003   hypothyroidism   Past Surgical History:  Procedure  Laterality Date   BREAST REDUCTION SURGERY Bilateral 12/08/2022   Procedure: MAMMARY REDUCTION  (BREAST);  Surgeon: Lowery Estefana RAMAN, DO;  Location: [facility];  Service: Plastics;  Laterality: Bilateral;   TONSILLECTOMY     uterine ablation     Social History   Social History Main Topics   Smoking status: Never Smoker    Smokeless tobacco: Never Used   Alcohol Use: No   Drug Use: No   Sexual Activity: Yes    Birth Control/ Protection: Condom   Social History Narrative   Marital status: married; no abuse; moved from [place] in 2014.      Children two children (8,3)      Lives: with husband, two children.      Employment:  Homemaker      Tobacco: none      Alcohol:  Socially      Drugs: none       Exercise:  Running every other day 2 miles alternating with 3 miles.       Seatbelt:  100%      Guns:  None      Sunscreen: SPF 50-70   Current Outpatient Medications on File Prior to Visit  Medication Sig Dispense Refill   Albuterol  Sulfate (PROAIR  RESPICLICK) 108 (90 Base) MCG/ACT AEPB Inhale 2 puffs into the lungs every 6 (six) hours as needed. 1 each 2   meloxicam  (MOBIC ) 15 MG tablet Take 1 tablet (15 mg total) by mouth daily. 30 tablet 0   metFORMIN  (GLUCOPHAGE ) 500 MG tablet Take 1 tablet (500 mg total) by mouth 3 (three) times a week.     SYNTHROID  137 MCG tablet Take 1 tablet (137 mcg total) by mouth daily before breakfast. 90 tablet 3   triamcinolone cream (KENALOG) 0.1 % Apply 1 Application topically as needed.     No current facility-administered medications on file prior to visit.   Allergies  Allergen Reactions   Gluten Meal Other (See Comments)   Family History  Problem Relation Age of Onset   Hypothyroidism Mother    Cancer Father 16       pancreatic cancer   Colon cancer Paternal Grandfather    Breast cancer Cousin    Rectal cancer Neg Hx    Stomach cancer Neg Hx    PE: BP 122/80   Pulse (!) 54   Ht 5' 4 (1.626 m)   Wt 185 lb 9.6 oz (84.2 kg)   SpO2 98%   BMI 31.86 kg/m  Wt Readings from Last 10 Encounters:  12/01/23 185 lb 9.6 oz (84.2 kg)  05/30/23 189 lb (85.7 kg)  05/02/23 188 lb (85.3 kg)  04/28/23 188 lb 2 oz (85.3 kg)  03/09/23 189 lb (85.7 kg)  02/23/23 180 lb (81.6 kg)  01/06/23 180 lb 8 oz (81.9 kg)  12/08/22 183 lb 10.3 oz (83.3 kg)  12/01/22 181 lb 9.6 oz (82.4 kg)  08/24/22 178 lb (80.7 kg)   Constitutional: overweight, in NAD Eyes:  EOMI, no exophthalmos ENT: no neck masses, no cervical lymphadenopathy Cardiovascular: RRR, No MRG Respiratory: CTA B Musculoskeletal: no deformities Skin:no rashes Neurological: no tremor with outstretched hands  ASSESSMENT: 1. Hypothyroidism - acquired  2.  Neck pressure/discomfort  PLAN:   1. Patient with hypothyroidism, on brand-name Synthroid  therapy - Her thyroid  test improved after moving levothyroxine  from bedtime to morning - latest thyroid  labs reviewed with pt. >> normal: Lab Results  Component Value Date   TSH 0.55 01/06/2023  -  she continues on Synthroid  d.a.w. 137 mcg daily, dose decreased by PCP 06/2022, as her TSH became suppressed after losing 17 pounds intentionally.  She gained 4 pounds since last visit. - pt feels good on this dose but she is frustrated with the weight gain, although this is expected in the setting of perimenopause.  She did start hormone replacement 3 months ago.  We discussed that this could affect her TFTs.  She would be interested in trying LT3. - we discussed about taking the thyroid  hormone every day, with water, >30 minutes before breakfast, separated by >4 hours from acid reflux medications, calcium, iron, multivitamins. Pt. is taking it correctly. - will check thyroid  tests today: TSH and fT4 - if we start LT3, we will need TFTs in 1.5 months - OTW, I will see her back in a year  2.  Neck pressure/discomfort - She previously described neck pressure/discomfort, possibly attributed to inflammation in the setting of Hashimoto's thyroiditis.  She noticed that this exacerbated after stopping gluten (?) - However, this is now resolved, except for globus sensation related to anxiety - Today's visit I do not feel thyroid  enlargement or other masses on palpation of her neck today - Will continue to follow her expectantly for now.  No imaging needed.  Needs refills.Needs LT3, also.  Orders Placed This Encounter  Procedures   TSH   T4, free   Lela Fendt, MD PhD [facility] Endocrinology

## 2023-12-01 NOTE — Patient Instructions (Signed)
Please stop at the lab.  Please continue Synthroid 137 mcg daily.  Take the thyroid hormone every day, with water, at least 30 minutes before breakfast, separated by at least 4 hours from: - acid reflux medications - calcium - iron - multivitamins  Please return in 1 year.

## 2023-12-02 ENCOUNTER — Ambulatory Visit: Payer: Self-pay | Admitting: Internal Medicine

## 2023-12-02 LAB — TSH: TSH: 0.03 m[IU]/L — ABNORMAL LOW

## 2023-12-02 LAB — T4, FREE: Free T4: 1.9 ng/dL — ABNORMAL HIGH (ref 0.8–1.8)

## 2023-12-02 MED ORDER — SYNTHROID 125 MCG PO TABS: 125.0000 ug | ORAL_TABLET | Freq: Every day | ORAL | 3 refills | Status: AC

## 2023-12-02 NOTE — Addendum Note (Signed)
 Addended by: TRIXIE FILE on: 12/02/2023 12:31 PM   Modules accepted: Orders

## 2024-01-24 DIAGNOSIS — E039 Hypothyroidism, unspecified: Secondary | ICD-10-CM

## 2024-02-02 ENCOUNTER — Ambulatory Visit: Admitting: Nurse Practitioner

## 2024-02-02 ENCOUNTER — Ambulatory Visit: Attending: Nurse Practitioner

## 2024-02-02 ENCOUNTER — Ambulatory Visit: Payer: Self-pay | Admitting: Nurse Practitioner

## 2024-02-02 VITALS — BP 138/80 | HR 55 | Temp 97.6°F | Ht 64.0 in | Wt 188.0 lb

## 2024-02-02 DIAGNOSIS — E039 Hypothyroidism, unspecified: Secondary | ICD-10-CM

## 2024-02-02 DIAGNOSIS — E66811 Obesity, class 1: Secondary | ICD-10-CM

## 2024-02-02 DIAGNOSIS — R7303 Prediabetes: Secondary | ICD-10-CM

## 2024-02-02 DIAGNOSIS — G4700 Insomnia, unspecified: Secondary | ICD-10-CM

## 2024-02-02 DIAGNOSIS — R001 Bradycardia, unspecified: Secondary | ICD-10-CM

## 2024-02-02 DIAGNOSIS — Z0000 Encounter for general adult medical examination without abnormal findings: Secondary | ICD-10-CM

## 2024-02-02 DIAGNOSIS — E785 Hyperlipidemia, unspecified: Secondary | ICD-10-CM

## 2024-02-02 DIAGNOSIS — R002 Palpitations: Secondary | ICD-10-CM

## 2024-02-02 DIAGNOSIS — Z0001 Encounter for general adult medical examination with abnormal findings: Secondary | ICD-10-CM

## 2024-02-02 LAB — CBC
HCT: 42.8 % (ref 36.0–46.0)
Hemoglobin: 14.5 g/dL (ref 12.0–15.0)
MCHC: 33.9 g/dL (ref 30.0–36.0)
MCV: 86.4 fl (ref 78.0–100.0)
Platelets: 319 K/uL (ref 150.0–400.0)
RBC: 4.95 Mil/uL (ref 3.87–5.11)
RDW: 13.8 % (ref 11.5–15.5)
WBC: 7 K/uL (ref 4.0–10.5)

## 2024-02-02 LAB — COMPREHENSIVE METABOLIC PANEL WITH GFR
ALT: 12 U/L (ref 3–35)
AST: 16 U/L (ref 5–37)
Albumin: 4.2 g/dL (ref 3.5–5.2)
Alkaline Phosphatase: 51 U/L (ref 39–117)
BUN: 13 mg/dL (ref 6–23)
CO2: 28 meq/L (ref 19–32)
Calcium: 9.1 mg/dL (ref 8.4–10.5)
Chloride: 104 meq/L (ref 96–112)
Creatinine, Ser: 0.83 mg/dL (ref 0.40–1.20)
GFR: 82.82 mL/min
Glucose, Bld: 100 mg/dL — ABNORMAL HIGH (ref 70–99)
Potassium: 4.5 meq/L (ref 3.5–5.1)
Sodium: 138 meq/L (ref 135–145)
Total Bilirubin: 0.6 mg/dL (ref 0.2–1.2)
Total Protein: 7 g/dL (ref 6.0–8.3)

## 2024-02-02 LAB — T3, FREE: T3, Free: 2.9 pg/mL (ref 2.3–4.2)

## 2024-02-02 LAB — LIPID PANEL
Cholesterol: 233 mg/dL — ABNORMAL HIGH (ref 28–200)
HDL: 50.4 mg/dL
LDL Cholesterol: 169 mg/dL — ABNORMAL HIGH (ref 10–99)
NonHDL: 183.04
Total CHOL/HDL Ratio: 5
Triglycerides: 72 mg/dL (ref 10.0–149.0)
VLDL: 14.4 mg/dL (ref 0.0–40.0)

## 2024-02-02 LAB — HEMOGLOBIN A1C: Hgb A1c MFr Bld: 5.5 % (ref 4.6–6.5)

## 2024-02-02 LAB — TSH: TSH: 1.85 u[IU]/mL (ref 0.35–5.50)

## 2024-02-02 LAB — T4, FREE: Free T4: 1.22 ng/dL (ref 0.60–1.60)

## 2024-02-02 MED ORDER — HYDROXYZINE PAMOATE 25 MG PO CAPS: 25.0000 mg | ORAL_CAPSULE | Freq: Every evening | ORAL | 0 refills | Status: AC | PRN

## 2024-02-02 NOTE — Assessment & Plan Note (Signed)
 Bradycardia and palpitations Intermittent bradycardia and nocturnal palpitations - Ordered a 7-day heart monitor to assess for arrhythmias. - Recommend referral for sleep apnea/sleep study. Patient declines for right now, but will let me know if she changes her mind.

## 2024-02-02 NOTE — Assessment & Plan Note (Signed)
 Labs ordered, further recommendations may be made based upon these results Follow-up with Dr. Trixie as scheduled

## 2024-02-02 NOTE — Assessment & Plan Note (Signed)
 Prediabetes Interest in checking current levels to assess status. - Ordered A1c test.

## 2024-02-02 NOTE — Assessment & Plan Note (Signed)
 Obesity Completed process for Wegovy, awaiting pill form. Discussed cost and dosage options. - Continue to monitor progress with [facility] and assess effectiveness once started.

## 2024-02-02 NOTE — Progress Notes (Unsigned)
 EP to read.

## 2024-02-02 NOTE — Assessment & Plan Note (Signed)
 Insomnia Chronic insomnia with difficulty maintaining sleep, possibly due to perimenopausal symptoms and stress. Hydroxyzine  chosen for treatment after shared decision making discussion. - Prescribed hydroxyzine  25mg  at bedtime PRN insomnia, to be taken 30-60 minutes before bedtime as needed. - Discussed sleep hygiene - Discussed need to have at least 8 hours before having to wake up prior to taking the medication

## 2024-02-02 NOTE — Progress Notes (Signed)
 "  Complete physical exam  Patient: Rebecca Meza   DOB: 1975-01-30   49 y.o. Female  MRN: [id number]  Subjective:    Chief Complaint  Patient presents with   Medical Management of Chronic Issues    Wants to talk about wegovy, pt stated she is getting her supply through an online and will be on 1.5 mg.    Rebecca Meza is a 49 y.o. female who presents today for a complete physical exam.   Discussed the use of AI scribe software for clinical note transcription with the patient, who gave verbal consent to proceed.  History of Present Illness Rebecca Meza is a 49 year old female who presents for an annual physical exam and discussion about Wegovy.  Obesity and weight management - Plans to initiate oral Wegovy soon after completing the process with an online provider  Prediabetes - Has prediabetes, last A1C 6.0  Hypothyroidism - Follows with Dr. Trixie, requesting to have thyroid  panel checked today in preparation for upcoming follow-up with endocrinology - Currently on synthroid  127mcg/day  Mood disturbance and perimenopausal symptoms - Depression and anxiety related to recent life stressors - Perimenopausal symptoms including poor sleep, feeling slower and heavier, and difficulty staying asleep - Frequent nocturnal awakenings  Cardiac symptoms during sleep - Low heart rate during sleep, sometimes as low as 35 bpm - Occasional nocturnal palpitations - No prior evaluation for sleep apnea - Minimal snoring but remains cautious regarding heart rate abnormalities  Health Maintenance - Due for a mammogram, managed by OBGYN - Declined breast exam today - Pap smear in August was normal per patient, need to request records - Reports up-to-date on covid booster and flu vaccine - Tdap due 2033 - Colon cancer screening due 2028     Most recent fall risk assessment:    02/02/2024    8:16 AM  Fall Risk   Falls in the past year? 0  Number falls in past yr: 0  Injury  with Fall? 0  Risk for fall due to : No Fall Risks  Follow up Falls evaluation completed     Most recent depression screenings:    02/02/2024    8:16 AM 04/28/2023    9:24 AM  PHQ 2/9 Scores  PHQ - 2 Score 2 0  PHQ- 9 Score 4       Past Medical History:  Diagnosis Date   Allergy    oral antihistamine; seasonal.   Anxiety    Asthma    Depression    Hx of sinus bradycardia    Hypothyroidism    Kidney cysts    right   Pre-diabetes    Thyroid  disease 01/19/2003   hypothyroidism   Past Surgical History:  Procedure Laterality Date   BREAST REDUCTION SURGERY Bilateral 12/08/2022   Procedure: MAMMARY REDUCTION  (BREAST);  Surgeon: Lowery Estefana RAMAN, DO;  Location: [facility];  Service: Plastics;  Laterality: Bilateral;   TONSILLECTOMY     uterine ablation     Social History   Socioeconomic History   Marital status: Married    Spouse name: Not on file   Number of children: Not on file   Years of education: Not on file   Highest education level: Bachelor's degree (e.g., BA, AB, BS)  Occupational History   Not on file  Tobacco Use   Smoking status: Never   Smokeless tobacco: Never  Vaping Use   Vaping status: Never Used  Substance and Sexual Activity  Alcohol use: No   Drug use: No   Sexual activity: Yes    Birth control/protection: Condom  Other Topics Concern   Not on file  Social History Narrative   Marital status: married x 12 years; happily married; no abuse; moved from [place] in 2014.      Children two children/sons (11, 6)      Lives: with husband, two children/sons.      Employment:  Homemaker      Tobacco: none      Alcohol:  Socially; 2-3 glasses of wine.      Drugs: none      Exercise:   Swimming. Sporadic.       Seatbelt:  100%; no texting      Guns:  None      Sunscreen: SPF 50-70   Social Drivers of Health   Tobacco Use: Low Risk (12/01/2023)   Patient History    Smoking Tobacco Use: Never    Smokeless Tobacco Use:  Never    Passive Exposure: Not on file  Financial Resource Strain: Low Risk (02/01/2024)   Overall Financial Resource Strain (CARDIA)    Difficulty of Paying Living Expenses: Not hard at all  Food Insecurity: No Food Insecurity (02/01/2024)   Epic    Worried About Programme Researcher, Broadcasting/film/video in the Last Year: Never true    Ran Out of Food in the Last Year: Never true  Transportation Needs: No Transportation Needs (02/01/2024)   Epic    Lack of Transportation (Medical): No    Lack of Transportation (Non-Medical): No  Physical Activity: Sufficiently Active (02/01/2024)   Exercise Vital Sign    Days of Exercise per Week: 4 days    Minutes of Exercise per Session: 90 min  Stress: No Stress Concern Present (02/01/2024)   [employer] of Occupational Health - Occupational Stress Questionnaire    Feeling of Stress: Only a little  Social Connections: Moderately Isolated (02/01/2024)   Social Connection and Isolation Panel    Frequency of Communication with Friends and Family: Never    Frequency of Social Gatherings with Friends and Family: Once a week    Attends Religious Services: Never    Database Administrator or Organizations: Yes    Attends Engineer, Structural: More than 4 times per year    Marital Status: Married  Catering Manager Violence: Not on file  Depression (PHQ2-9): Low Risk (02/02/2024)   Depression (PHQ2-9)    PHQ-2 Score: 4  Alcohol Screen: Low Risk (02/01/2024)   Alcohol Screen    Last Alcohol Screening Score (AUDIT): 1  Housing: Low Risk (02/01/2024)   Epic    Unable to Pay for Housing in the Last Year: No    Number of Times Moved in the Last Year: 0    Homeless in the Last Year: No  Utilities: Not on file  Health Literacy: Not on file   Family History  Problem Relation Age of Onset   Hypothyroidism Mother    Cancer Father 32       pancreatic cancer   Colon cancer Paternal Grandfather    Breast cancer Cousin    Rectal cancer Neg Hx    Stomach cancer Neg Hx     Allergies[1]    Patient Care Team: Elnor Lauraine BRAVO, NP as PCP - General (Nurse Practitioner)   Show/hide medication list[2]  Review of Systems  Constitutional:  Negative for fever.  Respiratory:  Negative for cough and shortness of breath.  Cardiovascular:  Negative for chest pain and palpitations.  Gastrointestinal:  Negative for abdominal pain and blood in stool.  Genitourinary:  Negative for dysuria and hematuria.  Psychiatric/Behavioral:  Positive for depression. Negative for suicidal ideas. The patient is nervous/anxious.           Objective:     BP 138/80   Pulse (!) 55   Temp 97.6 F (36.4 C) (Temporal)   Ht 5' 4 (1.626 m)   Wt 188 lb (85.3 kg)   SpO2 99%   BMI 32.27 kg/m  BP Readings from Last 3 Encounters:  02/02/24 138/80  12/01/23 122/80  05/30/23 126/82   Wt Readings from Last 3 Encounters:  02/02/24 188 lb (85.3 kg)  12/01/23 185 lb 9.6 oz (84.2 kg)  05/30/23 189 lb (85.7 kg)      Physical Exam Vitals reviewed.  Constitutional:      Appearance: Normal appearance.  HENT:     Head: Normocephalic and atraumatic.     Right Ear: Tympanic membrane, ear canal and external ear normal.     Left Ear: Tympanic membrane, ear canal and external ear normal.  Eyes:     General:        Right eye: No discharge.        Left eye: No discharge.     Extraocular Movements: Extraocular movements intact.     Conjunctiva/sclera: Conjunctivae normal.     Pupils: Pupils are equal, round, and reactive to light.  Neck:     Vascular: No carotid bruit.  Cardiovascular:     Rate and Rhythm: Normal rate and regular rhythm.     Pulses: Normal pulses.     Heart sounds: Normal heart sounds. No murmur heard. Pulmonary:     Effort: Pulmonary effort is normal.     Breath sounds: Normal breath sounds.  Chest:     Comments: Breast exam deferred per patient preference Abdominal:     General: Abdomen is flat. Bowel sounds are normal. There is no distension.      Palpations: Abdomen is soft. There is no mass.     Tenderness: There is no abdominal tenderness.  Musculoskeletal:        General: No tenderness.     Cervical back: Neck supple. No muscular tenderness.     Right lower leg: No edema.     Left lower leg: No edema.  Lymphadenopathy:     Cervical: No cervical adenopathy.     Upper Body:     Right upper body: No supraclavicular adenopathy.     Left upper body: No supraclavicular adenopathy.  Skin:    General: Skin is warm and dry.  Neurological:     General: No focal deficit present.     Mental Status: She is alert and oriented to person, place, and time.     Motor: No weakness.     Gait: Gait normal.  Psychiatric:        Mood and Affect: Mood normal.        Behavior: Behavior normal.        Judgment: Judgment normal.      No results found for any visits on 02/02/24.     Assessment & Plan:    Routine Health Maintenance and Physical Exam  Immunization History  Administered Date(s) Administered   Influenza, Seasonal, Injecte, Preservative Fre 12/06/2005   Influenza,inj,Quad PF,6+ Mos 01/18/2013, 12/31/2013, 09/10/2016, 09/28/2017, 11/30/2019, 11/28/2020, 12/03/2021   Influenza-Unspecified 10/19/2022   PFIZER(Purple Top)SARS-COV-2 Vaccination 04/09/2019, 04/30/2019  Pfizer(Comirnaty)Fall Seasonal Vaccine 12 years and older 10/10/2022   Tdap 01/18/2010, 12/03/2021   Unspecified SARS-COV-2 Vaccination 10/19/2023    Health Maintenance  Topic Date Due   Hepatitis C Screening  Never done   Pneumococcal Vaccine (1 of 2 - PCV) Never done   Hepatitis B Vaccines 19-59 Average Risk (1 of 3 - 19+ 3-dose series) Never done   Cervical Cancer Screening (HPV/Pap Cotest)  09/10/2021   Mammogram  02/19/2024   COVID-19 Vaccine (5 - Pfizer risk 2025-26 season) 04/18/2024   Colonoscopy  03/08/2026   DTaP/Tdap/Td (3 - Td or Tdap) 12/04/2031   Influenza Vaccine  Completed   HPV VACCINES (No Doses Required) Completed   HIV Screening   Completed   Meningococcal B Vaccine  Aged Out    Discussed health benefits of physical activity, and encouraged her to engage in regular exercise appropriate for her age and condition.  Problem List Items Addressed This Visit       Endocrine   Hypothyroidism   Relevant Medications   hydrOXYzine  (VISTARIL ) 25 MG capsule   Other Relevant Orders   CBC   Comprehensive metabolic panel with GFR   Hemoglobin A1c   TSH   Lipid panel   T3, free   T4, free   LONG TERM MONITOR (3-14 DAYS)     Other   Class 1 obesity   Relevant Medications   hydrOXYzine  (VISTARIL ) 25 MG capsule   Other Relevant Orders   CBC   Comprehensive metabolic panel with GFR   Hemoglobin A1c   TSH   Lipid panel   T3, free   T4, free   LONG TERM MONITOR (3-14 DAYS)   Encounter for general adult medical examination with abnormal findings - Primary   Relevant Medications   hydrOXYzine  (VISTARIL ) 25 MG capsule   Other Relevant Orders   CBC   Comprehensive metabolic panel with GFR   Hemoglobin A1c   TSH   Lipid panel   T3, free   T4, free   LONG TERM MONITOR (3-14 DAYS)   Hyperlipidemia   Relevant Medications   hydrOXYzine  (VISTARIL ) 25 MG capsule   Other Relevant Orders   CBC   Comprehensive metabolic panel with GFR   Hemoglobin A1c   TSH   Lipid panel   T3, free   T4, free   LONG TERM MONITOR (3-14 DAYS)   Prediabetes   Relevant Medications   hydrOXYzine  (VISTARIL ) 25 MG capsule   Other Relevant Orders   CBC   Comprehensive metabolic panel with GFR   Hemoglobin A1c   TSH   Lipid panel   T3, free   T4, free   LONG TERM MONITOR (3-14 DAYS)   Bradycardia   Relevant Medications   hydrOXYzine  (VISTARIL ) 25 MG capsule   Other Relevant Orders   CBC   Comprehensive metabolic panel with GFR   Hemoglobin A1c   TSH   Lipid panel   T3, free   T4, free   LONG TERM MONITOR (3-14 DAYS)   Palpitations   Relevant Medications   hydrOXYzine  (VISTARIL ) 25 MG capsule   Other Relevant Orders    CBC   Comprehensive metabolic panel with GFR   Hemoglobin A1c   TSH   Lipid panel   T3, free   T4, free   LONG TERM MONITOR (3-14 DAYS)    Assessment and Plan Assessment & Plan Insomnia Chronic insomnia with difficulty maintaining sleep, possibly due to perimenopausal symptoms and stress. Hydroxyzine  chosen for treatment after  shared decision making discussion. - Prescribed hydroxyzine  25mg  at bedtime PRN insomnia, to be taken 30-60 minutes before bedtime as needed. - Discussed sleep hygiene - Discussed need to have at least 8 hours before having to wake up prior to taking the medication  Bradycardia and palpitations Intermittent bradycardia and nocturnal palpitations - Ordered a 7-day heart monitor to assess for arrhythmias. - Recommend referral for sleep apnea/sleep study. Patient declines for right now, but will let me know if she changes her mind.   Prediabetes Interest in checking current levels to assess status. - Ordered A1c test.  Health maintenance Up to date with most screenings and vaccinations. Discussed need for mammogram post-breast reduction. -  Follow-up with OBGYN as recommended and complete mammogram - Continue routine colon cancer screening as per schedule.  - Obtain vaccine boosters as needed - Handout provided  Obesity Completed process for Wegovy, awaiting pill form. Discussed cost and dosage options. - Continue to monitor progress with [facility] and assess effectiveness once started.   In addition to annual exam I also completed an office visit as detailed above  Return in about 3 months (around 05/02/2024) for F/U with Lauraine.     Lauraine FORBES Pereyra, NP      [1]  Allergies Allergen Reactions   Gluten Meal Other (See Comments)  [2]  Outpatient Medications Prior to Visit  Medication Sig   semaglutide-weight management (WEGOVY) 1.5 MG tablet Take 1.5 mg by mouth daily. Daily in AM on an empty stomach with 4 oz of water. Do not eat or drink for 30  minutes after dose.   Albuterol  Sulfate (PROAIR  RESPICLICK) 108 (90 Base) MCG/ACT AEPB Inhale 2 puffs into the lungs every 6 (six) hours as needed.   estradiol (VIVELLE-DOT) 0.05 MG/24HR patch Apply 1 patch twice a week by transdermal route for 84 days.   metFORMIN  (GLUCOPHAGE ) 500 MG tablet Take 1 tablet (500 mg total) by mouth 3 (three) times a week.   progesterone (PROMETRIUM) 100 MG capsule take one tab by mouth the first 5 days of each month   SYNTHROID  125 MCG tablet Take 1 tablet (125 mcg total) by mouth daily before breakfast.   triamcinolone cream (KENALOG) 0.1 % Apply 1 Application topically as needed.   No facility-administered medications prior to visit.   "

## 2024-02-02 NOTE — Assessment & Plan Note (Signed)
 Health maintenance Up to date with most screenings and vaccinations. Discussed need for mammogram post-breast reduction. -  Follow-up with OBGYN as recommended and complete mammogram - Continue routine colon cancer screening as per schedule.  - Obtain vaccine boosters as needed - Handout provided

## 2024-02-14 ENCOUNTER — Other Ambulatory Visit: Payer: Self-pay | Admitting: Nurse Practitioner

## 2024-02-14 DIAGNOSIS — E66811 Obesity, class 1: Secondary | ICD-10-CM

## 2024-02-14 DIAGNOSIS — R7303 Prediabetes: Secondary | ICD-10-CM

## 2024-05-03 ENCOUNTER — Ambulatory Visit: Admitting: Nurse Practitioner

## 2024-11-30 ENCOUNTER — Ambulatory Visit: Admitting: Internal Medicine
